# Patient Record
Sex: Female | Born: 1985 | Race: Black or African American | Hispanic: No | Marital: Single | Smoking: Former smoker
Health system: Southern US, Community
[De-identification: ages and names within clinical notes are randomized; demographics above are authoritative.]

## PROBLEM LIST (undated history)

## (undated) DIAGNOSIS — I10 Essential (primary) hypertension: Secondary | ICD-10-CM

## (undated) DIAGNOSIS — J45909 Unspecified asthma, uncomplicated: Secondary | ICD-10-CM

## (undated) DIAGNOSIS — F419 Anxiety disorder, unspecified: Secondary | ICD-10-CM

## (undated) DIAGNOSIS — F4310 Post-traumatic stress disorder, unspecified: Secondary | ICD-10-CM

## (undated) HISTORY — PX: LEEP: SHX91

---

## 2007-07-12 ENCOUNTER — Encounter: Payer: Self-pay | Admitting: Obstetrics and Gynecology

## 2007-07-12 ENCOUNTER — Ambulatory Visit: Payer: Self-pay | Admitting: Obstetrics & Gynecology

## 2011-06-14 LAB — POCT URINALYSIS DIP (DEVICE)
Bilirubin Urine: NEGATIVE
Glucose, UA: NEGATIVE
Ketones, ur: NEGATIVE
Nitrite: NEGATIVE
pH: 8.5 — ABNORMAL HIGH

## 2014-07-29 ENCOUNTER — Encounter (HOSPITAL_COMMUNITY): Payer: Self-pay | Admitting: Physical Medicine and Rehabilitation

## 2014-07-29 ENCOUNTER — Emergency Department (HOSPITAL_COMMUNITY)
Admission: EM | Admit: 2014-07-29 | Discharge: 2014-07-29 | Disposition: A | Discharge status: Home / Self Care | Payer: Self-pay | Attending: Emergency Medicine | Admitting: Emergency Medicine

## 2014-07-29 DIAGNOSIS — Z720 Tobacco use: Secondary | ICD-10-CM | POA: Insufficient documentation

## 2014-07-29 DIAGNOSIS — Z3202 Encounter for pregnancy test, result negative: Secondary | ICD-10-CM | POA: Insufficient documentation

## 2014-07-29 DIAGNOSIS — Z711 Person with feared health complaint in whom no diagnosis is made: Secondary | ICD-10-CM

## 2014-07-29 DIAGNOSIS — N760 Acute vaginitis: Secondary | ICD-10-CM | POA: Insufficient documentation

## 2014-07-29 DIAGNOSIS — B9689 Other specified bacterial agents as the cause of diseases classified elsewhere: Secondary | ICD-10-CM

## 2014-07-29 DIAGNOSIS — Z202 Contact with and (suspected) exposure to infections with a predominantly sexual mode of transmission: Secondary | ICD-10-CM | POA: Insufficient documentation

## 2014-07-29 LAB — URINALYSIS, ROUTINE W REFLEX MICROSCOPIC
Glucose, UA: NEGATIVE mg/dL
Hgb urine dipstick: NEGATIVE
Ketones, ur: 15 mg/dL — AB
Nitrite: NEGATIVE
Protein, ur: NEGATIVE mg/dL
Specific Gravity, Urine: 1.03 (ref 1.005–1.030)
Urobilinogen, UA: 1 mg/dL (ref 0.0–1.0)
pH: 6 (ref 5.0–8.0)

## 2014-07-29 LAB — WET PREP, GENITAL
Trich, Wet Prep: NONE SEEN
Yeast Wet Prep HPF POC: NONE SEEN

## 2014-07-29 LAB — URINE MICROSCOPIC-ADD ON

## 2014-07-29 LAB — RPR

## 2014-07-29 LAB — POC URINE PREG, ED: Preg Test, Ur: NEGATIVE

## 2014-07-29 LAB — HIV ANTIBODY (ROUTINE TESTING W REFLEX): HIV 1&2 Ab, 4th Generation: NONREACTIVE

## 2014-07-29 MED ORDER — METRONIDAZOLE 500 MG PO TABS: 500.0000 mg | ORAL_TABLET | Freq: Two times a day (BID) | ORAL | Status: DC

## 2014-07-29 MED ORDER — LIDOCAINE HCL (PF) 1 % IJ SOLN
0.9000 mL | Freq: Once | INTRAMUSCULAR | Status: AC
  Administered 2014-07-29: 0.9 mL via INTRADERMAL
  Filled 2014-07-29: qty 5

## 2014-07-29 MED ORDER — CEFTRIAXONE SODIUM 250 MG IJ SOLR
250.0000 mg | Freq: Once | INTRAMUSCULAR | Status: AC
  Administered 2014-07-29: 250 mg via INTRAMUSCULAR
  Filled 2014-07-29: qty 250

## 2014-07-29 MED ORDER — AZITHROMYCIN 250 MG PO TABS
1000.0000 mg | ORAL_TABLET | Freq: Once | ORAL | Status: AC
Start: 2014-07-29 — End: 2014-07-29
  Administered 2014-07-29: 1000 mg via ORAL
  Filled 2014-07-29: qty 4

## 2014-07-29 NOTE — ED Notes (Signed)
Pt presents to department for evaluation of vaginal discharge and urinary frequency. Denies pain. Pt is alert and oriented x4. States symptoms started after intercourse last week.

## 2014-07-29 NOTE — ED Provider Notes (Signed)
CSN: [id number]     Arrival date & time 07/29/14  1049 History   First MD Initiated Contact with Patient 07/29/14 1100     Chief Complaint  Patient presents with  . Vaginal Discharge  . Urinary Frequency     (Consider location/radiation/quality/duration/timing/severity/associated sxs/prior Treatment) HPI Pt is a 28yo female presenting to ED with c/o vaginal discharge and urinary frequency that started 3-4 days ago after she had intercourse last week. Pt states it is uncomfortable to pee, and she has noticed a thick white vaginal discharge. Pt denies having BV in the past but has had yeast infections. Denies fever, n/v/d. Pt states she is new in town and does not have a PCP yet but would like to be checked for STDs and treated.    History reviewed. No pertinent past medical history. History reviewed. No pertinent past surgical history. No family history on file. History  Substance Use Topics  . Smoking status: Current Every Day Smoker  . Smokeless tobacco: Not on file  . Alcohol Use: No   OB History    No data available     Review of Systems  Constitutional: Negative for fever and chills.  Respiratory: Negative for cough and shortness of breath.   Gastrointestinal: Negative for nausea, vomiting, abdominal pain and diarrhea.  Genitourinary: Positive for dysuria, vaginal discharge and vaginal pain ( irritation). Negative for urgency, frequency, flank pain, decreased urine volume, vaginal bleeding and pelvic pain.  All other systems reviewed and are negative.     Allergies  Review of patient's allergies indicates no known allergies.  Home Medications   Prior to Admission medications   Medication Sig Start Date End Date Taking? Authorizing Provider  metroNIDAZOLE (FLAGYL) 500 MG tablet Take 1 tablet (500 mg total) by mouth 2 (two) times daily. One po bid x 7 days 07/29/14   Junius FinnerErin O'Malley, PA-C   BP 131/90 mmHg  Pulse 97  Temp(Src) 98.7 F (37.1 C) (Oral)  Resp 18  Ht 5'  3" (1.6 m)  Wt 120 lb (54.432 kg)  BMI 21.26 kg/m2  SpO2 99% Physical Exam  Constitutional: She appears well-developed and well-nourished. No distress.  HENT:  Head: Normocephalic and atraumatic.  Eyes: Conjunctivae are normal. No scleral icterus.  Neck: Normal range of motion.  Cardiovascular: Normal rate, regular rhythm and normal heart sounds.   Pulmonary/Chest: Effort normal and breath sounds normal. No respiratory distress. She has no wheezes. She has no rales. She exhibits no tenderness.  Abdominal: Soft. Bowel sounds are normal. She exhibits no distension and no mass. There is no tenderness. There is no rebound and no guarding.  Genitourinary: Uterus normal. There is no rash, tenderness, lesion or injury on the right labia. There is no rash, tenderness, lesion or injury on the left labia. Cervix exhibits no motion tenderness, no discharge and no friability. Right adnexum displays no mass, no tenderness and no fullness. Left adnexum displays no mass, no tenderness and no fullness. No erythema, tenderness or bleeding in the vagina. No foreign body around the vagina. No signs of injury around the vagina. Vaginal discharge ( white-clear discharge) found.  Chaperoned exam: exam significant for white-clear vaginal discharge. No CMT, adnexal tenderness or masses  Musculoskeletal: Normal range of motion.  Neurological: She is alert.  Skin: Skin is warm and dry. She is not diaphoretic.  Nursing note and vitals reviewed.   ED Course  Procedures (including critical care time) Labs Review Labs Reviewed  WET PREP, GENITAL - Abnormal; Notable for  the following:    Clue Cells Wet Prep HPF POC TOO NUMEROUS TO COUNT (*)    WBC, Wet Prep HPF POC MANY (*)    All other components within normal limits  URINALYSIS, ROUTINE W REFLEX MICROSCOPIC - Abnormal; Notable for the following:    Color, Urine AMBER (*)    APPearance CLOUDY (*)    Bilirubin Urine SMALL (*)    Ketones, ur 15 (*)    Leukocytes,  UA SMALL (*)    All other components within normal limits  URINE MICROSCOPIC-ADD ON - Abnormal; Notable for the following:    Squamous Epithelial / LPF MANY (*)    Bacteria, UA MANY (*)    Crystals CA OXALATE CRYSTALS (*)    All other components within normal limits  GC/CHLAMYDIA PROBE AMP  URINE CULTURE  RPR  HIV ANTIBODY (ROUTINE TESTING)  POC URINE PREG, ED    Imaging Review No results found.   EKG Interpretation None      MDM   Final diagnoses:  Bacterial vaginitis  Concern about STD in female without diagnosis   Pt c/u dysuria and vaginal discharge after unprotected sex 1 week ago. Denies fever, n/v/d. On exam: no CMT, adnexal tenderness or mass. Vaginal discharge present.  POC pregnancy: negative UA: many bacteria, however, negative for nitrites, small leukocytes.  Many squamous cells, likely contaminated. Will send urine culture as pt is symptomatic. Wet prep: many clue cells. Will tx for BV with flagyl.  GC/Chlamydia: pending.  Not concerned for ectopic pregnancy, ovarian torsion or other emergent process taking place at this time.   Pt treated empirically per her request, azithromycin and rocephin given in ED. Pt education for STDs and community resource guide provided.  Return precautions provided. Pt verbalized understanding and agreement with tx plan.    Junius Finnerrin O'Malley, PA-C 07/29/14 1329  Raeford RazorStephen Kohut, MD 07/30/14 [phone]

## 2014-07-29 NOTE — Discharge Instructions (Signed)
Refrain from sexual intercourse for 7 days. Be sure to have all partners tested and treated for STDs.  Practice safe sex by always wearing condoms.   Emergency Department Resource Guide 1) Find a Doctor and Pay Out of Pocket Although you won't have to find out who is covered by your insurance plan, it is a good idea to ask around and get recommendations. You will then need to call the office and see if the doctor you have chosen will accept you as a new patient and what types of options they offer for patients who are self-pay. Some doctors offer discounts or will set up payment plans for their patients who do not have insurance, but you will need to ask so you aren't surprised when you get to your appointment.  2) Contact Your Local Health Department Not all health departments have doctors that can see patients for sick visits, but many do, so it is worth a call to see if yours does. If you don't know where your local health department is, you can check in your phone book. The CDC also has a tool to help you locate your state's health department, and many state websites also have listings of all of their local health departments.  3) Find a Walk-in Clinic If your illness is not likely to be very severe or complicated, you may want to try a walk in clinic. These are popping up all over the country in pharmacies, drugstores, and shopping centers. They're usually staffed by nurse practitioners or physician assistants that have been trained to treat common illnesses and complaints. They're usually fairly quick and inexpensive. However, if you have serious medical issues or chronic medical problems, these are probably not your best option.  No Primary Care Doctor: - Call Health Connect at  [phone] - they can help you locate a primary care doctor that  accepts your insurance, provides certain services, etc. - Physician Referral Service- [phone]  Chronic Pain Problems: Organization          Address  Phone   Notes  [facility]  [phone] Patients need to be referred by their primary care doctor.   Medication Assistance: Organization         Address  Phone   Notes  [facility] [place] Medication [facility] [address] [place], [place] [address] [phone]) [phone] --Must be a resident of [facility] [place] -- Must have NO insurance coverage whatsoever (no Medicaid/ Medicare, etc.) -- The pt. MUST have a primary care doctor that directs their care regularly and follows them in the community   MedAssist  [phone]   [employer]  [phone]) [phone]    Agencies that provide inexpensive medical care: Buyer, retailrganization         Address                                                       Phone  Notes  [facility]  [phone]   [facility]    [phone]   [facility] [address] [place], [place] [address] [phone]) [phone]   [facility] [place] [address], [place] [phone]   [facility]anned Parenthood    [phone]) [phone]   [facility]    [phone]   [facility]  [address], [place] Phone:  [phone], Fax:  [phone]) [phone] Hours of Operation:  9 am - 6 pm, M-F.  Also accepts Medicaid/Medicare and self-pay.  [facility]  [address], [place] Phone: [phone], Fax: [phone]. Hours of Operation:  8:30 am - 5:30 pm, M-F.  Also accepts Medicaid and self-pay.  [facility] [place] [address], [place] Phone: [phone]   [facility] [address], [place], [place] [phone], Ext. [phone] Mondays & Thursdays: 7-9 AM.  First 15 patients are seen on a first come, first serve basis.    Medicaid-accepting [facility] Providers:  Organization         Address                                                                        Phone                               Notes  [facility] [address], [place] [phone]) [phone] Also accepts self-pay patients.  [facility] [address], [place]  [phone]   [facility] [address], [place] [phone]) [phone]   [facility] [address] [place] [address], [place] [phone]) [phone]   Renaye RakersVeita Bland [address], [place]   [phone] Only accepts [place] Access [place] patients after they have their name applied to their card.   Self-Pay (no insurance) in [facility]:   Organization         Address                                                     Phone               Notes  Sickle Cell Patients, [facility] [address], [place] [phone]) [phone]   [facility] [address], [place] [phone]   [facility] [place]  [address], [place] [phone]   [facility]/Dr. Osei-Bonsu  [address] [place] [address], [place] or [address], [place] [phone] Phone number for both [place] and [place] locations is the same.  [facility] [address], [place] [phone]) [phone]   [facility] [place] [address] [place] [address], [place] or [address] [phone]   [facility] [address], [place] [phone]) [phone], phone; [phone]) [phone], fax Sees patients 1st and 3rd Saturday of every month.  Must not qualify for public or private insurance (i.e. Medicaid, Medicare, [place] Health Choice, Veterans' Benefits)  Household income should be no more than 200% of the poverty level The clinic cannot treat you if you are pregnant or think you are pregnant  Sexually transmitted diseases are not treated at the clinic.    Dental  Care: Organization         Address                                  Phone                       Notes  [facility] [address], [place] [phone] Accepts children up to age 27 who are enrolled in [place]icaid or [place] Health Choice; pregnant women with a Medicaid card; and children who have applied for Medicaid or [place] Health Choice, but were declined, whose parents can pay a reduced fee at time of service.  [facility] of [facility] [place]  [address], [place] [phone] Accepts children up to age 27 who are enrolled in [place]icaid or [place] Health Choice; pregnant women with a Medicaid card; and children who have applied for Medicaid or [place] Health Choice, but were declined, whose parents can pay a reduced fee at time of service.  [facility]  [address], [place] [phone]) [phone] Patients are seen by appointment only. Walk-ins are not accepted. [facility] will see patients 27 years of age and older. Monday - Tuesday (8am-5pm) Most Wednesdays (8:30-5pm) $30 per visit, cash only  [facility]  [address], [facility] [phone]) [phone] Patients are seen by appointment only. Walk-ins are not accepted. [facility] will see patients 27 years of age and older. One Wednesday Evening (Monthly: Volunteer Based).  $30 per visit, cash only  [facility]  [phone] for adults; Children under age 584, call Graduate Pediatric Dentistry at [phone]. Children aged 254-14, please call [phone] to request a pediatric application.  Dental services are provided in all areas of dental care including fillings, crowns and bridges, complete and partial dentures, implants, gum treatment, root canals, and extractions. Preventive care is also provided. Treatment is provided to both adults and children. Patients are selected via a  lottery and there is often a waiting list.   [facility] [address], [place]  [phone]) [phone] [email or web address]   [facility] [address], [place], [place] [phone], Ext. [phone] Second and Fourth Thursday of each month, opens at 6:30 AM; Clinic ends at 9 AM.  Patients are seen on a first-come first-served basis, and a limited number are seen during each clinic.   [facility]  [address], [place], [place] [phone]   Eligibility Requirements You must have lived in [place], [place], or [place] counties for at least the last three months.   You cannot be eligible for state or federal sponsored National Cityhealthcare insurance, including CIGNAVeterans Administration, [place]icaid, or [employer] EntertainmentMedicare.   You generally cannot be eligible for healthcare insurance through your employer.    How to apply: Eligibility screenings are held every  Tuesday and Wednesday afternoon from 1:00 pm until 4:00 pm. You do not need an appointment for the interview!  [facility] [address], [place], [place] [phone]   [facility]  [phone]   [facility]  [phone]   [facility]  [phone]    Behavioral Health Resources in the Community: Intensive Outpatient Programs Organization         Address                                              Phone              Notes  [facility] [address], [place], [place] [phone]   [facility] [address], [place], [place] [phone]   [facility] [address], [place], [place]  [phone]   [facility] [address],  [place], [place] [phone] or [phone]   Substance Abuse Resources Organization         Address                                Phone  Notes  [facility] Drug [facility]  [phone]   [facility]   [phone]   [facility]  [phone]   [facility]  [phone]   Residential & Outpatient Substance Abuse Program  [phone]   Psychological Services Organization         Address                                  Phone                Notes  [facility]  [phone]- [phone]   [facility]  [phone]- [phone]   [facility] [address], [place] [phone] or [phone]    Mobile Crisis Teams Organization         Address  Phone  Notes  [facility], Mobile Crisis Care Unit  [phone]   [facility]  [address] [place], [place] [phone]   Doristine LocksSharon DeEsch [address] [place] [phone]    Self-Help/Support Groups Organization         Address                         Phone             Notes  [facility] of [place] - variety of support groups  336- [phone] Call for more information  Narcotics Anonymous (NA), [facility] [address], [place]  2 meetings at this location   Statisticianesidential Treatment Programs Organization         Address                                                    Phone              Notes  [facility] [phone]  [address],    [place]  [phone]   [facility]  [address], [place] [phone], [place], [place] [phone]   [facility] [address], [place] [phone] Admissions: 8am-3pm M-F  [facility] [address]    [place], [place] [phone]   [facility] [address], [place], [place] [phone]   [facility] [address]  [place], [place] [phone]   [facility] - Intensive Outpatient [address], [place], [place] [phone]   [facility] ([facility] [address]  [place], [place] [phone] or [phone]   [facility] ([facility]) [address] [place], [place] [phone] Accepts Medicaid  [facility] [address]  [place] [phone] Substance Abuse/Addiction Treatment   [facility] Behavioral Health Resources Organization         Address                                                            Phone                    Notes  [facility]  [phone]   Angie FavaJulie Brannon, PhD [address], [address] [place], [place]   [phone](336) [phone] or [phone]) [phone]   [facility]   [address] [place], [place] [phone]) [phone]   [facility] [address], [place], [place] [phone] Insurance/Medicaid/sponsorship through [facility]  Faith and Families [address]                                    [place], [place] [phone] Therapy/tele-psych/case  [facility] [address]   [place], [place] [phone]) [phone]    Dr. Lolly MustacheArfeen  [phone](336) [phone]   [facility] [place]  [employer] [facility] 1) [address],  2) [address], [place] 3)  [address], [place] [phone](336) [phone]  [phone](336) [phone]   [facility] Child Abuse Hotline [phone]) [phone] or [phone](336) [phone] (After Hours)

## 2014-07-30 LAB — URINE CULTURE: Colony Count: 9000

## 2014-07-30 LAB — GC/CHLAMYDIA PROBE AMP
CT Probe RNA: POSITIVE — AB
GC Probe RNA: NEGATIVE

## 2014-07-31 ENCOUNTER — Telehealth: Payer: Self-pay | Admitting: Emergency Medicine

## 2014-07-31 NOTE — Telephone Encounter (Signed)
Positive Chlamydia Treated with Rocephin and Zithromax, sensitive to same per protocol [facility] fax[facility]  Will contact patient.

## 2014-08-04 ENCOUNTER — Telehealth (HOSPITAL_COMMUNITY): Payer: Self-pay

## 2014-08-04 NOTE — ED Notes (Signed)
returned call. verified ID. informed of lab results. treated per protocol. advised to abstain from sexual activity x 10days and to notify partner(s) for testing and treatment

## 2015-02-20 ENCOUNTER — Encounter (HOSPITAL_COMMUNITY): Payer: Self-pay | Admitting: Emergency Medicine

## 2015-02-20 ENCOUNTER — Emergency Department (HOSPITAL_COMMUNITY)
Admission: EM | Admit: 2015-02-20 | Discharge: 2015-02-20 | Disposition: A | Discharge status: Home / Self Care | Payer: Self-pay | Attending: Emergency Medicine | Admitting: Emergency Medicine

## 2015-02-20 DIAGNOSIS — I10 Essential (primary) hypertension: Secondary | ICD-10-CM | POA: Insufficient documentation

## 2015-02-20 DIAGNOSIS — Z720 Tobacco use: Secondary | ICD-10-CM | POA: Insufficient documentation

## 2015-02-20 DIAGNOSIS — M25531 Pain in right wrist: Secondary | ICD-10-CM | POA: Insufficient documentation

## 2015-02-20 HISTORY — DX: Essential (primary) hypertension: I10

## 2015-02-20 MED ORDER — KETOROLAC TROMETHAMINE 60 MG/2ML IM SOLN
60.0000 mg | Freq: Once | INTRAMUSCULAR | Status: AC
  Administered 2015-02-20: 60 mg via INTRAMUSCULAR
  Filled 2015-02-20: qty 2

## 2015-02-20 NOTE — Discharge Instructions (Signed)
Return to the emergency room with worsening of symptoms, new symptoms or with symptoms that are concerning, especially fevers, redness, swelling, unable to move hands, numbness, tingling, weakness. Wear brace for first 24 hours and then every night. RICE: Rest, Ice (three cycles of 20 mins on, off at least twice a day), compression/brace, elevation. Heating pad works well for back pain. Ibuprofen 400mg  (2 tablets 200mg ) every 5-6 hours for 3-5 days. Follow up with PCP/orthopedist if symptoms worsen or are persistent. Read below information and follow recommendations. Carpal Tunnel Syndrome The carpal tunnel is a narrow area located on the palm side of your wrist. The tunnel is formed by the wrist bones and ligaments. Nerves, blood vessels, and tendons pass through the carpal tunnel. Repeated wrist motion or certain diseases may cause swelling within the tunnel. This swelling pinches the main nerve in the wrist (median nerve) and causes the painful hand and arm condition called carpal tunnel syndrome. CAUSES   Repeated wrist motions.  Wrist injuries.  Certain diseases like arthritis, diabetes, alcoholism, hyperthyroidism, and kidney failure.  Obesity.  Pregnancy. SYMPTOMS   A "pins and needles" feeling in your fingers or hand, especially in your thumb, index and middle fingers.  Tingling or numbness in your fingers or hand.  An aching feeling in your entire arm, especially when your wrist and elbow are bent for long periods of time.  Wrist pain that goes up your arm to your shoulder.  Pain that goes down into your palm or fingers.  A weak feeling in your hands. DIAGNOSIS  Your health care provider will take your history and perform a physical exam. An electromyography test may be needed. This test measures electrical signals sent out by your nerves into the muscles. The electrical signals are usually slowed by carpal tunnel syndrome. You may also need X-rays. TREATMENT    Carpal tunnel syndrome may clear up by itself. Your health care provider may recommend a wrist splint or medicine such as a nonsteroidal anti-inflammatory medicine. Cortisone injections may help. Sometimes, surgery may be needed to free the pinched nerve.  HOME CARE INSTRUCTIONS   Take all medicine as directed by your health care provider. Only take over-the-counter or prescription medicines for pain, discomfort, or fever as directed by your health care provider.  If you were given a splint to keep your wrist from bending, wear it as directed. It is important to wear the splint at night. Wear the splint for as long as you have pain or numbness in your hand, arm, or wrist. This may take 1 to 2 months.  Rest your wrist from any activity that may be causing your pain. If your symptoms are work-related, you may need to talk to your employer about changing to a job that does not require using your wrist.  Put ice on your wrist after long periods of wrist activity.  Put ice in a plastic bag.  Place a towel between your skin and the bag.  Leave the ice on for 15-20 minutes, 03-04 times a day.  Keep all follow-up visits as directed by your health care provider. This includes any orthopedic referrals, physical therapy, and rehabilitation. Any delay in getting necessary care could result in a delay or failure of your condition to heal. SEEK IMMEDIATE MEDICAL CARE IF:   You have new, unexplained symptoms.  Your symptoms get worse and are not helped or controlled with medicines. MAKE SURE YOU:   Understand these instructions.  Will watch your condition.  Will get help right away if you are not doing well or get worse. Document Released: 08/19/2000 Document Revised: 01/06/2014 Document Reviewed: 07/08/2011 [facility] Patient Information 2015 [place], [place]. This information is not intended to replace advice given to you by your health care provider. Make sure you discuss any questions you have  with your health care provider.

## 2015-02-20 NOTE — ED Provider Notes (Signed)
CSN: [id number]     Arrival date & time 02/20/15  1937 History  This chart was scribed for non-physician practitioner Oswaldo Conroy, PA-C working with Lorre Nick, MD by Lyndel Safe, ED Scribe. This patient was seen in room TR07C/TR07C and the patient's care was started at 8:17 PM.   Chief Complaint  Patient presents with  . Wrist Pain   The history is provided by the patient. No language interpreter was used.   HPI Comments: Taylor Sherman is a 29 y.o. female, with a PMhx of HTN, who presents to the Emergency Department complaining of intermittent, moderate right wrist pain and weakness that began 3 days ago. She notes the pain is exacerbated upon movement of her arm/wrist. Pt reports typing frequently and using her cell phone which also exacerbates the pain. She reports the pain began 2 days after she had blood drawn on Tuesday from her right wrist, after she noticed swelling and bruising to the site. She has taken a tramadol pta with no relief. Denies fevers, chills, numbness, tingling, redness, swelling, injury to left wrist.   Past Medical History  Diagnosis Date  . Hypertension    History reviewed. No pertinent past surgical history. No family history on file. History  Substance Use Topics  . Smoking status: Current Every Day Smoker  . Smokeless tobacco: Not on file  . Alcohol Use: No   OB History    No data available     Review of Systems  Constitutional: Negative for fever and chills.  Musculoskeletal: Positive for myalgias and arthralgias ( right wrist ).  Skin: Negative for color change and wound.  Neurological: Negative for weakness and numbness.   Allergies  Review of patient's allergies indicates no known allergies.  Home Medications   Prior to Admission medications   Medication Sig Start Date End Date Taking? Authorizing Provider  metroNIDAZOLE (FLAGYL) 500 MG tablet Take 1 tablet (500 mg total) by mouth 2 (two) times daily. One po bid x 7 days 07/29/14    Junius Finner, PA-C   BP 159/98 mmHg  Pulse 76  Temp(Src) 97.5 F (36.4 C) (Oral)  Resp 14  Ht  (1.6 m)  Wt 117 lb (53.071 kg)  BMI 20.73 kg/m2  SpO2 98%  LMP 02/13/2015 Physical Exam  Constitutional: She appears well-developed and well-nourished. No distress.  HENT:  Head: Normocephalic and atraumatic.  Eyes: Conjunctivae and EOM are normal. Right eye exhibits no discharge. Left eye exhibits no discharge.  Cardiovascular: Normal rate and regular rhythm.   2+ radial pulses equal bilaterally.   Pulmonary/Chest: Effort normal and breath sounds normal. No respiratory distress. She has no wheezes.  Abdominal: Soft. Bowel sounds are normal. She exhibits no distension. There is no tenderness.  Musculoskeletal:  Pain with right wrist flexion.  Positive phalens test. Tenderness to right thenar eminence and right radial wrist. FROM No swelling or erythema.  Neurological: She is alert. She exhibits normal muscle tone. Coordination normal.  5/5 strength bilaterally. Sensation intact.   Skin: Skin is warm and dry. She is not diaphoretic.  Nursing note and vitals reviewed.  ED Course  Procedures  DIAGNOSTIC STUDIES: Oxygen Saturation is 98% on RA, normal by my interpretation.    COORDINATION OF CARE: 8:21 PM Discussed treatment plan to order NSAID injection with pt. Advised RICE therapy and ibuprofen. Pt acknowledges and agrees to plan.   Labs Review Labs Reviewed - No data to display  Imaging Review No results found.   EKG Interpretation None  MDM   Final diagnoses:  Right wrist pain   Patient presenting with right wrist pain. No injury. Neurovascularly intact. No significant swelling or erythema. Full range of motion. I doubt septic arthritis. Patient with positive Phalen's test as well as reported history of repetitive use with typing and using her cell phone. 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 Patient given brace in ED and instructed on ibuprofen use  as well as rice. Patient to follow-up with PCP/orthopedics for persistent symptoms.  Discussed return precautions with patient. Discussed all results and patient verbalizes understanding and agrees with plan.  I personally performed the services described in this documentation, which was scribed in my presence. The recorded information has been reviewed and is accurate.    Oswaldo Conroy, PA-C 02/20/15 2033  Lorre Nick, MD 02/21/15 2124

## 2015-02-20 NOTE — ED Notes (Signed)
No answer when called 

## 2015-02-20 NOTE — ED Notes (Signed)
Pt. reports right wrist pain onset Tuesday this week , pt. stated pain started when she had a venipuncture for blood test at her wrist .

## 2015-09-22 ENCOUNTER — Emergency Department (HOSPITAL_COMMUNITY)
Admission: EM | Admit: 2015-09-22 | Discharge: 2015-09-22 | Disposition: A | Discharge status: Home / Self Care | Payer: Medicaid Other | Attending: Emergency Medicine | Admitting: Emergency Medicine

## 2015-09-22 ENCOUNTER — Encounter (HOSPITAL_COMMUNITY): Payer: Self-pay

## 2015-09-22 ENCOUNTER — Emergency Department (HOSPITAL_COMMUNITY): Payer: Medicaid Other

## 2015-09-22 DIAGNOSIS — M546 Pain in thoracic spine: Secondary | ICD-10-CM | POA: Diagnosis not present

## 2015-09-22 DIAGNOSIS — F17200 Nicotine dependence, unspecified, uncomplicated: Secondary | ICD-10-CM | POA: Diagnosis not present

## 2015-09-22 DIAGNOSIS — Z3202 Encounter for pregnancy test, result negative: Secondary | ICD-10-CM | POA: Diagnosis not present

## 2015-09-22 DIAGNOSIS — I10 Essential (primary) hypertension: Secondary | ICD-10-CM | POA: Insufficient documentation

## 2015-09-22 DIAGNOSIS — M549 Dorsalgia, unspecified: Secondary | ICD-10-CM | POA: Diagnosis present

## 2015-09-22 LAB — CBC WITH DIFFERENTIAL/PLATELET
BASOS ABS: 0 10*3/uL (ref 0.0–0.1)
Basophils Relative: 1 %
EOS ABS: 0.1 10*3/uL (ref 0.0–0.7)
EOS PCT: 3 %
HEMATOCRIT: 42.9 % (ref 36.0–46.0)
Hemoglobin: 13.9 g/dL (ref 12.0–15.0)
Lymphocytes Relative: 51 %
Lymphs Abs: 1.9 10*3/uL (ref 0.7–4.0)
MCH: 29.4 pg (ref 26.0–34.0)
MCHC: 32.4 g/dL (ref 30.0–36.0)
MCV: 90.9 fL (ref 78.0–100.0)
MONO ABS: 0.4 10*3/uL (ref 0.1–1.0)
MONOS PCT: 11 %
Neutro Abs: 1.2 10*3/uL — ABNORMAL LOW (ref 1.7–7.7)
Neutrophils Relative %: 34 %
PLATELETS: 209 10*3/uL (ref 150–400)
RBC: 4.72 MIL/uL (ref 3.87–5.11)
RDW: 13.8 % (ref 11.5–15.5)
WBC: 3.6 10*3/uL — ABNORMAL LOW (ref 4.0–10.5)

## 2015-09-22 LAB — COMPREHENSIVE METABOLIC PANEL
ALBUMIN: 4.1 g/dL (ref 3.5–5.0)
ALT: 30 U/L (ref 14–54)
ANION GAP: 12 (ref 5–15)
AST: 28 U/L (ref 15–41)
Alkaline Phosphatase: 44 U/L (ref 38–126)
BUN: 14 mg/dL (ref 6–20)
CALCIUM: 9.8 mg/dL (ref 8.9–10.3)
CO2: 21 mmol/L — AB (ref 22–32)
CREATININE: 0.77 mg/dL (ref 0.44–1.00)
Chloride: 106 mmol/L (ref 101–111)
GFR calc Af Amer: >60 mL/min (ref >60)
GFR calc non Af Amer: >60 mL/min (ref >60)
GLUCOSE: 88 mg/dL (ref 65–99)
Potassium: 4.3 mmol/L (ref 3.5–5.1)
SODIUM: 139 mmol/L (ref 135–145)
TOTAL PROTEIN: 6.7 g/dL (ref 6.5–8.1)
Total Bilirubin: 0.4 mg/dL (ref 0.3–1.2)

## 2015-09-22 LAB — URINALYSIS, ROUTINE W REFLEX MICROSCOPIC
BILIRUBIN URINE: NEGATIVE
GLUCOSE, UA: NEGATIVE mg/dL
Hgb urine dipstick: NEGATIVE
KETONES UR: NEGATIVE mg/dL
LEUKOCYTES UA: NEGATIVE
NITRITE: NEGATIVE
PH: 6.5 (ref 5.0–8.0)
Protein, ur: NEGATIVE mg/dL
SPECIFIC GRAVITY, URINE: 1.023 (ref 1.005–1.030)

## 2015-09-22 LAB — I-STAT TROPONIN, ED: TROPONIN I, POC: 0 ng/mL (ref 0.00–0.08)

## 2015-09-22 LAB — D-DIMER, QUANTITATIVE: D-Dimer, Quant: <0.27 ug/mL-FEU (ref 0.00–0.50)

## 2015-09-22 LAB — LIPASE, BLOOD: Lipase: 37 U/L (ref 11–51)

## 2015-09-22 LAB — POC URINE PREG, ED: Preg Test, Ur: NEGATIVE

## 2015-09-22 MED ORDER — KETOROLAC TROMETHAMINE 30 MG/ML IJ SOLN
30.0000 mg | Freq: Once | INTRAMUSCULAR | Status: AC
  Administered 2015-09-22: 30 mg via INTRAVENOUS
  Filled 2015-09-22: qty 1

## 2015-09-22 MED ORDER — IBUPROFEN 600 MG PO TABS: 600.0000 mg | ORAL_TABLET | Freq: Three times a day (TID) | ORAL | Status: DC | PRN

## 2015-09-22 MED ORDER — SODIUM CHLORIDE 0.9 % IV BOLUS (SEPSIS)
1000.0000 mL | Freq: Once | INTRAVENOUS | Status: AC
  Administered 2015-09-22: 1000 mL via INTRAVENOUS

## 2015-09-22 NOTE — ED Notes (Signed)
Patient here with right sided flank pain x 3 days, denies injury, denies urinary symptoms

## 2015-09-22 NOTE — ED Provider Notes (Signed)
CSN: [id number]     Arrival date & time 09/22/15  1351 History   First MD Initiated Contact with Patient 09/22/15 1859     Chief Complaint  Patient presents with  . Flank Pain     (Consider location/radiation/quality/duration/timing/severity/associated sxs/prior Treatment) HPI  30 year old female presents with right-sided flank/back pain for the past 3 days. Started all of a sudden and has been gradually worsening. Pain is constant. Occasionally the pain seems to radiate up to her right shoulder. It is occasionally worse with deep breaths. Certain movements also make the pain worse. Does not remember any trauma. No urinary symptoms including no dysuria or hematuria. No abdominal pain. No vaginal symptoms. Denies cough or shortness of breath. Originally tried [employer] and Tums with no relief. Has also been taking Tylenol but has not helped. No nausea or vomiting. Has an implantable birth control in her arm. No leg swelling.  Past Medical History  Diagnosis Date  . Hypertension    History reviewed. No pertinent past surgical history. No family history on file. Social History  Substance Use Topics  . Smoking status: Current Every Day Smoker  . Smokeless tobacco: None  . Alcohol Use: No   OB History    No data available     Review of Systems  Constitutional: Negative for fever.  Respiratory: Negative for shortness of breath.   Cardiovascular: Negative for chest pain.  Gastrointestinal: Negative for nausea, vomiting and abdominal pain.  Genitourinary: Positive for flank pain. Negative for dysuria and hematuria.  Musculoskeletal: Positive for back pain.  All other systems reviewed and are negative.     Allergies  Review of patient's allergies indicates no known allergies.  Home Medications   Prior to Admission medications   Medication Sig Start Date End Date Taking? Authorizing Provider  metroNIDAZOLE (FLAGYL) 500 MG tablet Take 1 tablet (500 mg total) by mouth 2 (two) times  daily. One po bid x 7 days 07/29/14   Junius Finner, PA-C   BP 128/81 mmHg  Pulse 75  Temp(Src) 98 F (36.7 C) (Oral)  Resp 20  Ht  (1.575 m)  Wt 115 lb (52.164 kg)  BMI 21.03 kg/m2  SpO2 100% Physical Exam  Constitutional: She is oriented to person, place, and time. She appears well-developed and well-nourished.  HENT:  Head: Normocephalic and atraumatic.  Right Ear: External ear normal.  Left Ear: External ear normal.  Nose: Nose normal.  Eyes: Right eye exhibits no discharge. Left eye exhibits no discharge.  Cardiovascular: Normal rate, regular rhythm and normal heart sounds.   Pulmonary/Chest: Effort normal and breath sounds normal.    Abdominal: Soft. She exhibits no distension. There is no tenderness.  Neurological: She is alert and oriented to person, place, and time.  Skin: Skin is warm and dry.  Nursing note and vitals reviewed.   ED Course  Procedures (including critical care time) Labs Review Labs Reviewed  URINALYSIS, ROUTINE W REFLEX MICROSCOPIC (NOT AT [facility]) - Abnormal; Notable for the following:    APPearance HAZY (*)    All other components within normal limits  COMPREHENSIVE METABOLIC PANEL - Abnormal; Notable for the following:    CO2 21 (*)    All other components within normal limits  CBC WITH DIFFERENTIAL/PLATELET - Abnormal; Notable for the following:    WBC 3.6 (*)    Neutro Abs 1.2 (*)    All other components within normal limits  LIPASE, BLOOD  D-DIMER, QUANTITATIVE (NOT AT [facility])  POC URINE PREG, ED  Rosezena Sensor, ED    Imaging Review Dg Chest 2 View  09/22/2015  CLINICAL DATA:  Right chest pain for 4 days. No known injury. Initial encounter. EXAM: CHEST  2 VIEW COMPARISON:  None. FINDINGS: The lungs are clear. Heart size is normal. No pneumothorax or pleural effusion. No focal bony abnormality. IMPRESSION: Negative chest. Electronically Signed   By: Drusilla Kanner M.D.   On: 09/22/2015 20:15   US Abdomen Complete  09/22/2015   CLINICAL DATA:  Three-day history of right flank region pain EXAM: ABDOMEN ULTRASOUND COMPLETE COMPARISON:  None. FINDINGS: Gallbladder: Gallbladder wall is upper normal in thickness with mild contraction of the gallbladder. No pericholecystic fluid. No gallstones apparent. No sonographic Murphy sign noted by sonographer. Common bile duct: Diameter: 2 mm. No intrahepatic, common hepatic, or common bile duct dilatation. Liver: No focal lesion identified. Within normal limits in parenchymal echogenicity. IVC: No abnormality visualized. Pancreas: No mass or inflammatory focus. Spleen: Size and appearance within normal limits. Right Kidney: Length: 10.4 cm. Echogenicity within normal limits. No mass or hydronephrosis visualized. Left Kidney: Length: 11.1 cm. Echogenicity within normal limits. No mass or hydronephrosis visualized. Abdominal aorta: No aneurysm visualized. Other findings: No demonstrable ascites. IMPRESSION: Gallbladder slightly contracted with wall thickness upper normal. Gallbladder otherwise appears normal. If there remains concern for potential gallbladder pathology, nuclear medicine hepatobiliary imaging study could be helpful to assess for cystic duct patency. Study otherwise unremarkable. Electronically Signed   By: Bretta Bang III M.D.   On: 09/22/2015 20:27   I have personally reviewed and evaluated these images and lab results as part of my medical decision-making.   EKG Interpretation   Date/Time:  Tuesday September 22 2015 19:44:10 EST Ventricular Rate:  57 PR Interval:  139 QRS Duration: 81 QT Interval:  399 QTC Calculation: 388 R Axis:   74 Text Interpretation:  Sinus bradycardia ST elev, probable normal early  repol pattern No old tracing to compare Confirmed by Darren Caldron  MD, Wealthy Danielski  ([id number]) on 09/22/2015 8:06:03 PM      MDM   Final diagnoses:  Right-sided thoracic back pain    Patient's right-sided back pain over her lower ribs is most likely musculoskeletal in  etiology. Given that there was no trauma with pleuritic symptoms and otherwise low risk patient a d-dimer was sent and is negative. No further workup for PE indicated. No evidence of hydronephrosis or obvious gallbladder pathology. Her gallbladder is not quite normal but there is no right upper quadrant pain. Do not feel further workup indicated with benign LFTs. Much better after Toradol. Lantus she with NSAIDs and recommend follow-up with a PCP for further care. Advised of return precautions.    Pricilla Loveless, MD 09/22/15 2330

## 2016-01-23 ENCOUNTER — Emergency Department (HOSPITAL_COMMUNITY): Payer: Medicaid Other

## 2016-01-23 ENCOUNTER — Emergency Department (HOSPITAL_COMMUNITY)
Admission: EM | Admit: 2016-01-23 | Discharge: 2016-01-23 | Disposition: A | Discharge status: Home / Self Care | Payer: Medicaid Other | Attending: Emergency Medicine | Admitting: Emergency Medicine

## 2016-01-23 ENCOUNTER — Encounter (HOSPITAL_COMMUNITY): Payer: Self-pay | Admitting: Emergency Medicine

## 2016-01-23 DIAGNOSIS — F4310 Post-traumatic stress disorder, unspecified: Secondary | ICD-10-CM | POA: Diagnosis not present

## 2016-01-23 DIAGNOSIS — Z3202 Encounter for pregnancy test, result negative: Secondary | ICD-10-CM | POA: Insufficient documentation

## 2016-01-23 DIAGNOSIS — F17200 Nicotine dependence, unspecified, uncomplicated: Secondary | ICD-10-CM | POA: Diagnosis not present

## 2016-01-23 DIAGNOSIS — F419 Anxiety disorder, unspecified: Secondary | ICD-10-CM | POA: Diagnosis present

## 2016-01-23 DIAGNOSIS — I10 Essential (primary) hypertension: Secondary | ICD-10-CM | POA: Insufficient documentation

## 2016-01-23 DIAGNOSIS — R079 Chest pain, unspecified: Secondary | ICD-10-CM | POA: Diagnosis not present

## 2016-01-23 DIAGNOSIS — J45909 Unspecified asthma, uncomplicated: Secondary | ICD-10-CM | POA: Insufficient documentation

## 2016-01-23 HISTORY — DX: Unspecified asthma, uncomplicated: J45.909

## 2016-01-23 HISTORY — DX: Post-traumatic stress disorder, unspecified: F43.10

## 2016-01-23 LAB — BASIC METABOLIC PANEL
ANION GAP: 14 (ref 5–15)
BUN: 11 mg/dL (ref 6–20)
CALCIUM: 10.2 mg/dL (ref 8.9–10.3)
CO2: 18 mmol/L — ABNORMAL LOW (ref 22–32)
Chloride: 108 mmol/L (ref 101–111)
Creatinine, Ser: 0.83 mg/dL (ref 0.44–1.00)
GLUCOSE: 93 mg/dL (ref 65–99)
POTASSIUM: 3.7 mmol/L (ref 3.5–5.1)
SODIUM: 140 mmol/L (ref 135–145)

## 2016-01-23 LAB — CBC
HEMATOCRIT: 45.5 % (ref 36.0–46.0)
HEMOGLOBIN: 15.2 g/dL — AB (ref 12.0–15.0)
MCH: 30.3 pg (ref 26.0–34.0)
MCHC: 33.4 g/dL (ref 30.0–36.0)
MCV: 90.6 fL (ref 78.0–100.0)
Platelets: 265 10*3/uL (ref 150–400)
RBC: 5.02 MIL/uL (ref 3.87–5.11)
RDW: 13 % (ref 11.5–15.5)
WBC: 5.1 10*3/uL (ref 4.0–10.5)

## 2016-01-23 LAB — D-DIMER, QUANTITATIVE (NOT AT ARMC)

## 2016-01-23 LAB — I-STAT BETA HCG BLOOD, ED (MC, WL, AP ONLY)

## 2016-01-23 LAB — I-STAT TROPONIN, ED: TROPONIN I, POC: 0 ng/mL (ref 0.00–0.08)

## 2016-01-23 MED ORDER — LORAZEPAM 2 MG/ML IJ SOLN
1.0000 mg | Freq: Once | INTRAMUSCULAR | Status: AC
  Administered 2016-01-23: 1 mg via INTRAVENOUS
  Filled 2016-01-23: qty 1

## 2016-01-23 MED ORDER — LORAZEPAM 2 MG/ML IJ SOLN: 1.0000 mg | Freq: Once | INTRAMUSCULAR | Status: DC

## 2016-01-23 MED ORDER — SODIUM CHLORIDE 0.9 % IV BOLUS (SEPSIS)
1000.0000 mL | Freq: Once | INTRAVENOUS | Status: AC
  Administered 2016-01-23: 1000 mL via INTRAVENOUS

## 2016-01-23 MED ORDER — LORAZEPAM 1 MG PO TABS: 1.0000 mg | ORAL_TABLET | Freq: Three times a day (TID) | ORAL | Status: DC | PRN
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
## 2016-01-23 NOTE — ED Notes (Signed)
RN accompanied pt to restroom.  Waited outside.  This RN began to hear patient heavy breathing and opened door.  Pt actively having a panic attack.  This RN stayed with patient doing her attack and then accompanied her back to room and placed her back on monitor.  Phlebotomy and GPD at bedside at this time.

## 2016-01-23 NOTE — Discharge Instructions (Signed)
Nonspecific Chest Pain  °Chest pain can be caused by many different conditions. There is always a chance that your pain could be related to something serious, such as a heart attack or a blood clot in your lungs. Chest pain can also be caused by conditions that are not life-threatening. If you have chest pain, it is very important to follow up with your health care provider. °CAUSES  °Chest pain can be caused by: °· Heartburn. °· Pneumonia or bronchitis. °· Anxiety or stress. °· Inflammation around your heart (pericarditis) or lung (pleuritis or pleurisy). °· A blood clot in your lung. °· A collapsed lung (pneumothorax). It can develop suddenly on its own (spontaneous pneumothorax) or from trauma to the chest. °· Shingles infection (varicella-zoster virus). °· Heart attack. °· Damage to the bones, muscles, and cartilage that make up your chest wall. This can include: °¨ Bruised bones due to injury. °¨ Strained muscles or cartilage due to frequent or repeated coughing or overwork. °¨ Fracture to one or more ribs. °¨ Sore cartilage due to inflammation (costochondritis). °RISK FACTORS  °Risk factors for chest pain may include: °· Activities that increase your risk for trauma or injury to your chest. °· Respiratory infections or conditions that cause frequent coughing. °· Medical conditions or overeating that can cause heartburn. °· Heart disease or family history of heart disease. °· Conditions or health behaviors that increase your risk of developing a blood clot. °· Having had chicken pox (varicella zoster). °SIGNS AND SYMPTOMS °Chest pain can feel like: °· Burning or tingling on the surface of your chest or deep in your chest. °· Crushing, pressure, aching, or squeezing pain. °· Dull or sharp pain that is worse when you move, cough, or take a deep breath. °· Pain that is also felt in your back, neck, shoulder, or arm, or pain that spreads to any of these areas. °Your chest pain may come and go, or it may stay  constant. °DIAGNOSIS °Lab tests or other studies may be needed to find the cause of your pain. Your health care provider may have you take a test called an ambulatory ECG (electrocardiogram). An ECG records your heartbeat patterns at the time the test is performed. You may also have other tests, such as: °· Transthoracic echocardiogram (TTE). During echocardiography, sound waves are used to create a picture of all of the heart structures and to look at how blood flows through your heart. °· Transesophageal echocardiogram (TEE). This is a more advanced imaging test that obtains images from inside your body. It allows your health care provider to see your heart in finer detail. °· Cardiac monitoring. This allows your health care provider to monitor your heart rate and rhythm in real time. °· Holter monitor. This is a portable device that records your heartbeat and can help to diagnose abnormal heartbeats. It allows your health care provider to track your heart activity for several days, if needed. °· Stress tests. These can be done through exercise or by taking medicine that makes your heart beat more quickly. °· Blood tests. °· Imaging tests. °TREATMENT  °Your treatment depends on what is causing your chest pain. Treatment may include: °· Medicines. These may include: °¨ Acid blockers for heartburn. °¨ Anti-inflammatory medicine. °¨ Pain medicine for inflammatory conditions. °¨ Antibiotic medicine, if an infection is present. °¨ Medicines to dissolve blood clots. °¨ Medicines to treat coronary artery disease. °· Supportive care for conditions that do not require medicines. This may include: °¨ Resting. °¨ Applying heat   or cold packs to injured areas.  Limiting activities until pain decreases. HOME CARE INSTRUCTIONS  If you were prescribed an antibiotic medicine, finish it all even if you start to feel better.  Avoid any activities that bring on chest pain.  Do not use any tobacco products, including  cigarettes, chewing tobacco, or electronic cigarettes. If you need help quitting, ask your health care provider.  Do not drink alcohol.  Take medicines only as directed by your health care provider.  Keep all follow-up visits as directed by your health care provider. This is important. This includes any further testing if your chest pain does not go away.  If heartburn is the cause for your chest pain, you may be told to keep your head raised (elevated) while sleeping. This reduces the chance that acid will go from your stomach into your esophagus.  Make lifestyle changes as directed by your health care provider. These may include:  Getting regular exercise. Ask your health care provider to suggest some activities that are safe for you.  Eating a heart-healthy diet. A registered dietitian can help you to learn healthy eating options.  Maintaining a healthy weight.  Managing diabetes, if necessary.  Reducing stress. SEEK MEDICAL CARE IF:  Your chest pain does not go away after treatment.  You have a rash with blisters on your chest.  You have a fever. SEEK IMMEDIATE MEDICAL CARE IF:   Your chest pain is worse.  You have an increasing cough, or you cough up blood.  You have severe abdominal pain.  You have severe weakness.  You faint.  You have chills.  You have sudden, unexplained chest discomfort.  You have sudden, unexplained discomfort in your arms, back, neck, or jaw.  You have shortness of breath at any time.  You suddenly start to sweat, or your skin gets clammy.  You feel nauseous or you vomit.  You suddenly feel light-headed or dizzy.  Your heart begins to beat quickly, or it feels like it is skipping beats. These symptoms may represent a serious problem that is an emergency. Do not wait to see if the symptoms will go away. Get medical help right away. Call your local emergency services (911 in the [place] Do not drive yourself to the hospital.   This  information is not intended to replace advice given to you by your health care provider. Make sure you discuss any questions you have with your health care provider.   Document Released: 06/01/2005 Document Revised: 09/12/2014 Document Reviewed: 03/28/2014 Elsevier Interactive Patient Education 2016 [employer] Posttraumatic Stress Disorder Posttraumatic stress disorder (PTSD) is a mental disorder. It occurs after a traumatic event in your life. The traumatic events that cause PTSD are outside the range of normal human experience. Examples of these events include war, automobile accidents, natural disasters, rape, domestic violence, and violent crimes. Most people who experience these types of events are able to heal on their own. Those who do not heal develop PTSD. PTSD can happen to anyone at any age. However, people with a history of childhood abuse are at increased risk for developing PTSD.  SYMPTOMS  The traumatic event that causes PTSD must be a threat to life, cause serious injury, or involve sexual violence. The traumatic event is usually experienced directly by the person who develops PTSD. Sometimes PTSD occurs in people who witness traumas that occur to others or who hear about a trauma that occurs to a close family member or friend. The following behaviors  are characteristic of people with PTSD:  People with PTSD re-experience the traumatic event in one or more of the following ways (intrusion symptoms):  Recurrent, unwanted distressing memories while awake.  Recurrent distressing dreams.  Sensations similar to those felt when the event originally occurred (flashbacks).   Intense or prolonged emotional distress, triggered by reminders of the trauma. This may include fear, horror, intense sadness, or anger.  Marked physical reactions, triggered by reminders of the trauma. This may include racing heart, shortness of breath, sweating, and shaking.  People with PTSD avoid thoughts,  conversations, people, or activities that remind them of the traumatic event (avoidance symptoms).  People with PTSD have negative changes in their thinking and mood after the traumatic event. These changes include:  Inability to remember one or more significant aspects of the traumatic event (memory gaps).  Exaggerated negative perceptions about themselves or others, such as believing that they are bad people or that no one can be trusted.  Unrealistic assignment of blame to themselves or others for the traumatic event.  Persistent negative emotional state, such as fear, horror, anger, sadness, guilt, or shame.  Markedly decreased interest or participation in significant activities.  A loss of connection with other people.  Inability to experience positive emotions, such as happiness or love.  People with PTSD are more sensitive to their environment and react more easily than others (hyperarousal-overreactivity symptoms). These symptoms include:  Irritability, with angry outbursts toward other people or objects. The outbursts are easily triggered and may be verbal or physical.  Careless or self-destructive behavior. This may include reckless driving or drug use.  A feeling of being on edge, with increased alertness (hypervigilance).  Exaggerated reactions to stimuli, such as being easily startled.   Difficulty concentrating.  Difficulty sleeping. PTSD symptoms may start soon after a frightening event or months or years later. They last at least 1 month or longer and can affect one or more areas of functioning, such as social or occupational functioning.  DIAGNOSIS  PTSD is diagnosed through an assessment by a mental health professional. Taylor QuinYou will be asked questions about the traumatic events in your life. You will also be asked about how these events have changed your thoughts, mood, behavior, and ability to function on a daily basis. You may be asked about your use of alcohol or  drugs, which can make PTSD symptoms worse. TREATMENT  Unlike many mental disorders, which require lifelong management, PTSD is a curable condition. The goal of PTSD treatment is to neutralize the negative effects of the traumatic event on daily functioning, not erase the memory of the event. The following treatments may be prescribed to reach this goal:  Medicines. Certain medicines can reduce some PTSD symptoms. Intrusion symptoms and hyperarousal-overactivity symptoms respond best to medicines.  Counseling (talk therapy). Talk therapy with a mental health professional who is experienced in treating PTSD can help. Talk therapy can provide education, emotional support, and coping skills. Certain types of talk therapy that specifically target the traumatic events are the most effective treatment for PTSD:  Prolonged exposure therapy, which involves remembering and processing the traumatic event with a therapist in a safe environment until it no longer creates a negative emotional response.  Eye movement desensitization and reprocessing therapy, which involves the use of repetitive physical stimulation of the senses that alternates between the right and left sides of the body. It is believed that this therapy facilitates communication between the two sides of the brain. This communication helps the  mind to integrate the fragmented memories of the traumatic event into a whole story that makes sense and no longer creates a negative emotional response. Most people with PTSD benefit from a combination of these treatments.    This information is not intended to replace advice given to you by your health care provider. Make sure you discuss any questions you have with your health care provider.   Document Released: 05/17/2001 Document Revised: 09/12/2014 Document Reviewed: 11/08/2012 Elsevier Interactive Patient Education [employer]

## 2016-01-23 NOTE — ED Notes (Signed)
Ambulated PT. Pt feels better than she did before, but feels slightly dizzy.

## 2016-01-23 NOTE — ED Provider Notes (Signed)
CSN: [id number]     Arrival date & time 01/23/16  0035 History   First MD Initiated Contact with Patient 01/23/16 0129     Chief Complaint  Patient presents with  . Chest Pain     (Consider location/radiation/quality/duration/timing/severity/associated sxs/prior Treatment) HPI  Patient recently diagnosed with PTSD and also has a PMH of hypertension comes to the ER after developing chest pain, crying and anxiety. She was at a cook out when she got into a verbal, emotionally heated conversation at [employer] and decided to take her children home and leave. She reports having severe anxiety, CP, and symptoms consistent with a PTSD attack and developed severe pain. EMS gave aspirin and nitro.  She denies having any symptoms today prior to this incident.  PCP: No PCP Per Patient Kandiss A Rajewski is a 30 y.o.  female  ROS: The patient denies diaphoresis, fever, headache, weakness (general or focal), confusion, change of vision,  dysphagia, aphagia, shortness of breath,  abdominal pains, nausea, vomiting, diarrhea, lower extremity swelling, rash, neck pain.   Past Medical History  Diagnosis Date  . Hypertension   . PTSD (post-traumatic stress disorder)   . Asthma    History reviewed. No pertinent past surgical history. History reviewed. No pertinent family history. Social History  Substance Use Topics  . Smoking status: Current Some Day Smoker  . Smokeless tobacco: None  . Alcohol Use: No   OB History    No data available     Review of Systems  Review of Systems All other systems negative except as documented in the HPI. All pertinent positives and negatives as reviewed in the HPI.   Allergies  Review of patient's allergies indicates no known allergies.  Home Medications   Prior to Admission medications   Medication Sig Start Date End Date Taking? Authorizing Provider  ibuprofen (ADVIL,MOTRIN) 600 MG tablet Take 1 tablet (600 mg total) by mouth every 8 (eight) hours as  needed. Patient not taking: Reported on 01/23/2016 09/22/15   Pricilla Loveless, MD  LORazepam (ATIVAN) 1 MG tablet Take 1 tablet (1 mg total) by mouth 3 (three) times daily as needed for anxiety. 01/23/16   Caffie Sotto Neva Seat, PA-C   BP 120/71 mmHg  Pulse 95  Temp(Src) 97.9 F (36.6 C) (Oral)  Resp 19  Ht  (1.575 m)  Wt 53.071 kg  BMI 21.39 kg/m2  SpO2 100% Physical Exam  Constitutional: She is oriented to person, place, and time. She appears well-developed and well-nourished. She appears distressed (emotional distress.).  HENT:  Head: Normocephalic and atraumatic.  Nose: Nose normal.  Mouth/Throat: Uvula is midline, oropharynx is clear and moist and mucous membranes are normal.  Eyes: Pupils are equal, round, and reactive to light.  Neck: Normal range of motion. Neck supple.  Cardiovascular: Normal rate and regular rhythm.   Pulmonary/Chest: Effort normal.  Abdominal: Soft.  No signs of abdominal distention  Musculoskeletal:  No LE swelling  Neurological: She is alert and oriented to person, place, and time.  Skin: Skin is warm and dry. No rash noted.  Psychiatric: Her speech is normal and behavior is normal. Thought content normal. Her mood appears anxious. She does not exhibit a depressed mood.  + tearful  Nursing note and vitals reviewed.   ED Course  Procedures (including critical care time) Labs Review Labs Reviewed  BASIC METABOLIC PANEL - Abnormal; Notable for the following:    CO2 18 (*)    All other components within normal limits  CBC -  Abnormal; Notable for the following:    Hemoglobin 15.2 (*)    All other components within normal limits  D-DIMER, QUANTITATIVE (NOT [facility])  I-STAT TROPOININ, ED  I-STAT BETA HCG BLOOD, ED ([facility], [facility], [facility] ONLY)    Imaging Review Dg Chest 2 View  01/23/2016  CLINICAL DATA:  Shortness of breath. EXAM: CHEST  2 VIEW COMPARISON:  None. FINDINGS: The heart size and mediastinal contours are within normal limits. Both lungs are clear.  The visualized skeletal structures are unremarkable. IMPRESSION: No active cardiopulmonary disease. Electronically Signed   By: Gerome Samavid  Williams III M.D   On: 01/23/2016 01:21   I have personally reviewed and evaluated these images and lab results as part of my medical decision-making.   EKG Interpretation   Date/Time:  Saturday Jan 23 2016 00:42:21 EDT Ventricular Rate:  99 PR Interval:  150 QRS Duration: 93 QT Interval:  326 QTC Calculation: 418 R Axis:   63 Text Interpretation:  Sinus tachycardia LAE, consider biatrial enlargement  RSR' in V1 or V2, probably normal variant No significant change since last  tracing other than rate is faster Confirmed by WARD,  DO, KRISTEN ([id number])  on 01/23/2016 12:46:40 AM      MDM   Final diagnoses:  PTSD (post-traumatic stress disorder)  Chest pain, unspecified chest pain type    Chest xray unremarkable, CBC, BMP, and trop i-stat are all unremarkable. Neg hCG. Patient given 1mg  IV Ativan and has showed significant improvement in her anxiety. She, however, continues to be tachycardic at 110- d-dimer and fluids added. Patients symptoms are very atypical for PE and anxiety and stress reaction is the most likely cause. She is not longer having chest discomfort.  5: 03 am - pt has a neg d-dimer continues to not have SOB or CP. Tachycardia resolved with fluids. She was ambulated in room by RN without difficulties. Patient reuquesting dc, she will see her psychiatric/counselor soon. Will rx a small amount of Ativan prn.  Very strict return to ED precautions discussed.  Marlon Peliffany Dniyah Grant, PA-C 01/23/16 [id number]  Zadie Rhineonald Wickline, MD 01/23/16 [phone]

## 2016-12-16 ENCOUNTER — Emergency Department (HOSPITAL_COMMUNITY)
Admission: EM | Admit: 2016-12-16 | Discharge: 2016-12-16 | Disposition: A | Discharge status: Home / Self Care | Payer: Medicaid Other | Attending: Emergency Medicine | Admitting: Emergency Medicine

## 2016-12-16 ENCOUNTER — Encounter (HOSPITAL_COMMUNITY): Payer: Self-pay | Admitting: *Deleted

## 2016-12-16 DIAGNOSIS — Z87891 Personal history of nicotine dependence: Secondary | ICD-10-CM | POA: Insufficient documentation

## 2016-12-16 DIAGNOSIS — J45909 Unspecified asthma, uncomplicated: Secondary | ICD-10-CM | POA: Insufficient documentation

## 2016-12-16 DIAGNOSIS — Z79899 Other long term (current) drug therapy: Secondary | ICD-10-CM | POA: Diagnosis not present

## 2016-12-16 DIAGNOSIS — I10 Essential (primary) hypertension: Secondary | ICD-10-CM | POA: Insufficient documentation

## 2016-12-16 DIAGNOSIS — K0889 Other specified disorders of teeth and supporting structures: Secondary | ICD-10-CM | POA: Diagnosis present

## 2016-12-16 MED ORDER — CLINDAMYCIN HCL 150 MG PO CAPS: 450.0000 mg | ORAL_CAPSULE | Freq: Three times a day (TID) | ORAL | 0 refills | Status: DC

## 2016-12-16 NOTE — ED Triage Notes (Signed)
Pt states L gum pain since last night.  No relief with oral pain gel.

## 2016-12-16 NOTE — ED Notes (Signed)
Papers reviewed with patient by Wyvonnia Dusky student, with myself present to answer any farther questions. No questions and understanding verbalized

## 2016-12-16 NOTE — Discharge Instructions (Signed)
Begin taking clindamycin 3 times daily for 10 days. Ibuprofen as needed for pain. Follow up with dentist as soon as possible. Return to ED for worsening pain, extension of swelling, drainage, bleeding, trouble breathing.

## 2016-12-16 NOTE — ED Provider Notes (Signed)
[facility]-EMERGENCY DEPT Provider Note   CSN: [id number] Arrival date & time: 12/16/16  4098  By signing my name below, I, Taylor Sherman, attest that this documentation has been prepared under the direction and in the presence of Krithi Bray, PA-C. Electronically Signed: Orpah Sherman , ED Scribe. 12/16/16. 4:06 PM.   History   Chief Complaint Chief Complaint  Patient presents with  . Dental Pain    HPI Taylor Sherman is a 31 y.o. female who presents to the Emergency Department complaining of constat L sided dental pain with onset x12 hours. Pt states that she began experiencing L sided dental pain last night. She denies any similar episodes of pain in the past and reports the pain radiating to the L ear. She reports L sided dental pain, L sided abscess, appetite change. Pt has tried Orajel with no relief and reports the pain being exacerbated with swallowing. She denies cough, sneezing, fever, nausea, drainage from the area. She denies hx of cavity in the area, biting her cheek recently. Of note, pt reports being seen by her dentist x1 month ago where she states the visit was normal.    The history is provided by the patient. No language interpreter was used.    Past Medical History:  Diagnosis Date  . Asthma   . Hypertension   . PTSD (post-traumatic stress disorder)     There are no active problems to display for this patient.   History reviewed. No pertinent surgical history.  OB History    No data available       Home Medications    Prior to Admission medications   Medication Sig Start Date End Date Taking? Authorizing Provider  clindamycin (CLEOCIN) 150 MG capsule Take 3 capsules (450 mg total) by mouth 3 (three) times daily. 12/16/16   Courtland Coppa, PA-C  ibuprofen (ADVIL,MOTRIN) 600 MG tablet Take 1 tablet (600 mg total) by mouth every 8 (eight) hours as needed. Patient not taking: Reported on 01/23/2016 09/22/15   Pricilla Loveless, MD  LORazepam (ATIVAN) 1 MG tablet  Take 1 tablet (1 mg total) by mouth 3 (three) times daily as needed for anxiety. 01/23/16   Marlon Pel, PA-C    Family History No family history on file.  Social History Social History  Substance Use Topics  . Smoking status: Former Games developer  . Smokeless tobacco: Never Used  . Alcohol use No     Allergies   Patient has no known allergies.   Review of Systems Review of Systems  Constitutional: Positive for appetite change. Negative for fever.  HENT: Positive for dental problem. Negative for sneezing.        Oral abscess  Respiratory: Negative for cough.   Gastrointestinal: Negative for nausea.     Physical Exam Updated Vital Signs BP (!) 155/96 (BP Location: Right Arm)   Pulse (!) 109   Temp 98.2 F (36.8 C) (Oral)   Resp 16   Ht  (1.575 m)   Wt 60.8 kg   SpO2 100%   BMI 24.51 kg/m   Physical Exam  Constitutional: She appears well-developed and well-nourished. No distress.  HENT:  Head: Normocephalic and atraumatic.  Mouth/Throat: Normal dentition.  Swelling on the rear of the L sided molar, there is no active drainage or bleeding. Pt has good dentition.     Eyes: Conjunctivae are normal.  Neck: Neck supple.  Cardiovascular: Normal rate and regular rhythm.   No murmur heard. Pulmonary/Chest: Effort normal and breath sounds normal.  No respiratory distress.  Abdominal: Soft. There is no tenderness.  Musculoskeletal: She exhibits no edema.  Neurological: She is alert.  Skin: Skin is warm and dry.  Psychiatric: She has a normal mood and affect.  Nursing note and vitals reviewed.    ED Treatments / Results   DIAGNOSTIC STUDIES: Oxygen Saturation is 100% on RA, normal by my interpretation.   COORDINATION OF CARE: 4:06 PM-Discussed next steps with pt. Pt verbalized understanding and is agreeable with the plan.    Labs (all labs ordered are listed, but only abnormal results are displayed) Labs Reviewed - No data to display  EKG  EKG  Interpretation None       Radiology No results found.  Procedures Procedures (including critical care time)  Medications Ordered in ED Medications - No data to display   Initial Impression / Assessment and Plan / ED Course  I have reviewed the triage vital signs and the nursing notes.  Pertinent labs & imaging results that were available during my care of the patient were reviewed by me and considered in my medical decision making (see chart for details).     Patient's history and symptoms concerning for dental abscess/infection. Patient has no history of symptoms similar to this in the past. No signs of spread or Ludwig's angina present. Patient is nontoxic appearing, no fevers or color change. Will give clindamycin to cover for infection here. Anti-inflammatories for pain control. Told to follow up with dentist as soon as possible for further evaluation. Strict return precautions given.  Final Clinical Impressions(s) / ED Diagnoses   Final diagnoses:  Pain, dental    New Prescriptions Discharge Medication List as of 12/16/2016 10:11 AM    START taking these medications   Details  clindamycin (CLEOCIN) 150 MG capsule Take 3 capsules (450 mg total) by mouth 3 (three) times daily., Starting Fri 12/16/2016, Print       I personally performed the services described in this documentation, which was scribed in my presence. The recorded information has been reviewed and is accurate.     Dietrich Pates, PA-C 12/16/16 1607    Tilden Fossa, MD 12/17/16 [phone]

## 2017-04-25 ENCOUNTER — Emergency Department (HOSPITAL_COMMUNITY): Payer: Medicaid Other

## 2017-04-25 ENCOUNTER — Emergency Department (HOSPITAL_COMMUNITY)
Admission: EM | Admit: 2017-04-25 | Discharge: 2017-04-25 | Disposition: A | Discharge status: Home / Self Care | Payer: Medicaid Other | Attending: Emergency Medicine | Admitting: Emergency Medicine

## 2017-04-25 ENCOUNTER — Encounter (HOSPITAL_COMMUNITY): Payer: Self-pay | Admitting: Emergency Medicine

## 2017-04-25 ENCOUNTER — Other Ambulatory Visit: Payer: Self-pay

## 2017-04-25 DIAGNOSIS — Z79899 Other long term (current) drug therapy: Secondary | ICD-10-CM | POA: Insufficient documentation

## 2017-04-25 DIAGNOSIS — Z791 Long term (current) use of non-steroidal anti-inflammatories (NSAID): Secondary | ICD-10-CM | POA: Diagnosis not present

## 2017-04-25 DIAGNOSIS — I10 Essential (primary) hypertension: Secondary | ICD-10-CM | POA: Insufficient documentation

## 2017-04-25 DIAGNOSIS — F17210 Nicotine dependence, cigarettes, uncomplicated: Secondary | ICD-10-CM | POA: Diagnosis not present

## 2017-04-25 DIAGNOSIS — R079 Chest pain, unspecified: Secondary | ICD-10-CM

## 2017-04-25 DIAGNOSIS — F419 Anxiety disorder, unspecified: Secondary | ICD-10-CM | POA: Insufficient documentation

## 2017-04-25 DIAGNOSIS — J45909 Unspecified asthma, uncomplicated: Secondary | ICD-10-CM | POA: Diagnosis not present

## 2017-04-25 HISTORY — DX: Anxiety disorder, unspecified: F41.9

## 2017-04-25 LAB — CBC
HEMATOCRIT: 42 % (ref 36.0–46.0)
HEMOGLOBIN: 13.8 g/dL (ref 12.0–15.0)
MCH: 29 pg (ref 26.0–34.0)
MCHC: 32.9 g/dL (ref 30.0–36.0)
MCV: 88.2 fL (ref 78.0–100.0)
Platelets: 242 10*3/uL (ref 150–400)
RBC: 4.76 MIL/uL (ref 3.87–5.11)
RDW: 13.5 % (ref 11.5–15.5)
WBC: 5.1 10*3/uL (ref 4.0–10.5)

## 2017-04-25 LAB — BASIC METABOLIC PANEL
ANION GAP: 9 (ref 5–15)
BUN: <5 mg/dL — ABNORMAL LOW (ref 6–20)
CHLORIDE: 107 mmol/L (ref 101–111)
CO2: 22 mmol/L (ref 22–32)
CREATININE: 0.85 mg/dL (ref 0.44–1.00)
Calcium: 9.8 mg/dL (ref 8.9–10.3)
GFR calc non Af Amer: >60 mL/min (ref >60)
Glucose, Bld: 83 mg/dL (ref 65–99)
POTASSIUM: 4 mmol/L (ref 3.5–5.1)
SODIUM: 138 mmol/L (ref 135–145)

## 2017-04-25 LAB — I-STAT TROPONIN, ED: Troponin i, poc: 0 ng/mL (ref 0.00–0.08)

## 2017-04-25 MED ORDER — IBUPROFEN 400 MG PO TABS
600.0000 mg | ORAL_TABLET | Freq: Once | ORAL | Status: AC
  Administered 2017-04-25: 600 mg via ORAL
  Filled 2017-04-25: qty 1

## 2017-04-25 NOTE — ED Triage Notes (Signed)
Pt sts cough and pain with cough on left side x 2 days

## 2017-04-25 NOTE — ED Triage Notes (Signed)
Pt came to triage to report she is having chest pain radiating to left arm.  States onset 2 days.  Has coughed several times.  Brought to triage for EKG, blood work.

## 2017-04-25 NOTE — ED Notes (Signed)
Informed Jeff - PA of pt's BP.

## 2017-04-25 NOTE — ED Provider Notes (Signed)
[facility]-EMERGENCY DEPT Provider Note   CSN: [id number] Arrival date & time: 04/25/17  1438     History   Chief Complaint Chief Complaint  Patient presents with  . Cough  . Chest Pain    HPI Taylor Sherman is a 31 y.o. female.  HPI   31 year old female presents today with complaints of chest pain. Patient notes that at 1 PM today while laying in bed she had a panic attack reporting her heart was beating rapidly, she had numbness down her left arm and associated chest pain. Patient notes rapid respirations and shortness of breath all which improved shortly after the panic attack. Patient notes history of the same. She notes that she still has a dull ache in her left chest, reports that pushing on her chest makes symptoms better, and breathing makes him worse. Patient denies any history of DVT or PE, she denies any significant risk factors other than smoking. Patient denies any personal cardiac history, but does note a family history of MIs. Patient and she took her anti-anxiety medication but this did not improve her symptoms. She denies any cough, fever, or any other infectious etiology. She denies any lower extremity swelling or edema.    Past Medical History:  Diagnosis Date  . Anxiety   . Asthma   . Hypertension   . PTSD (post-traumatic stress disorder)     There are no active problems to display for this patient.   History reviewed. No pertinent surgical history.  OB History    No data available       Home Medications    Prior to Admission medications   Medication Sig Start Date End Date Taking? Authorizing Provider  clindamycin (CLEOCIN) 150 MG capsule Take 3 capsules (450 mg total) by mouth 3 (three) times daily. 12/16/16   Khatri, Hina, PA-C  ibuprofen (ADVIL,MOTRIN) 600 MG tablet Take 1 tablet (600 mg total) by mouth every 8 (eight) hours as needed. Patient not taking: Reported on 01/23/2016 09/22/15   Pricilla Loveless, MD  LORazepam (ATIVAN) 1 MG tablet Take 1  tablet (1 mg total) by mouth 3 (three) times daily as needed for anxiety. 01/23/16   Marlon Pel, PA-C    Family History History reviewed. No pertinent family history.  Social History Social History  Substance Use Topics  . Smoking status: Current Every Day Smoker  . Smokeless tobacco: Never Used  . Alcohol use No     Allergies   Patient has no known allergies.   Review of Systems Review of Systems  All other systems reviewed and are negative.  Physical Exam Updated Vital Signs BP (!) 154/100 (BP Location: Right Arm)   Pulse 77   Temp 98.9 F (37.2 C) (Oral)   Resp 18   SpO2 99%   Physical Exam  Constitutional: She is oriented to person, place, and time. She appears well-developed and well-nourished.  HENT:  Head: Normocephalic and atraumatic.  Eyes: Pupils are equal, round, and reactive to light. Conjunctivae are normal. Right eye exhibits no discharge. Left eye exhibits no discharge. No scleral icterus.  Neck: Normal range of motion. No JVD present. No tracheal deviation present.  Cardiovascular: Normal rate, regular rhythm, normal heart sounds and intact distal pulses.  Exam reveals no gallop and no friction rub.   No murmur heard. Pulmonary/Chest: Effort normal and breath sounds normal. No stridor. No respiratory distress. She has no wheezes. She has no rales. She exhibits tenderness.  TTP of mid sternum   Abdominal: Soft.  She exhibits no distension and no mass. There is no tenderness. There is no rebound and no guarding. No hernia.  Musculoskeletal: Normal range of motion. She exhibits no edema.  Neurological: She is alert and oriented to person, place, and time. Coordination normal.  Skin: Skin is warm.  Psychiatric: She has a normal mood and affect. Her behavior is normal. Judgment and thought content normal.  Nursing note and vitals reviewed.    ED Treatments / Results  Labs (all labs ordered are listed, but only abnormal results are displayed) Labs  Reviewed  BASIC METABOLIC PANEL - Abnormal; Notable for the following:       Result Value   BUN <5 (*)    All other components within normal limits  CBC  I-STAT TROPONIN, ED    EKG  EKG Interpretation None       Radiology Dg Chest 2 View  Result Date: 04/25/2017 CLINICAL DATA:  Pt c/o central chest pain, left arm numbness, SOB, nausea, and dry cough x 1 day. Hx of asthma AND HTN. Pt is a current smoker. EXAM: CHEST  2 VIEW COMPARISON:  01/23/2016 FINDINGS: The heart size and mediastinal contours are within normal limits. Both lungs are clear. The visualized skeletal structures are unremarkable. IMPRESSION: Negative.  No active disease. Electronically Signed   By: Myles Rosenthal M.D.   On: 04/25/2017 15:04    Procedures Procedures (including critical care time)  Medications Ordered in ED Medications  ibuprofen (ADVIL,MOTRIN) tablet 600 mg (600 mg Oral Given 04/25/17 1713)     Initial Impression / Assessment and Plan / ED Course  I have reviewed the triage vital signs and the nursing notes.  Pertinent labs & imaging results that were available during my care of the patient were reviewed by me and considered in my medical decision making (see chart for details).     Final Clinical Impressions(s) / ED Diagnoses   Final diagnoses:  Anxiety  Chest pain, unspecified type    Labs: I-STAT troponin, BNP, CBC   Assessment/Plan: 31 year old female presents status complaints of anxiety and chest pain. This is likely anxiety in nature. She is a 31 year old healthy female with very low heart score with reassuring workup here. I have very low suspicion for ACS, she is perked negative flow suspicion for PE or any other significant intrathoracic abnormality. Patient will follow up as an outpatient, she will return medially with any new or worsening signs or symptoms. Patient verbalized understanding and agreement to today's plan had no further questions or concerns at the time  discharge.      New Prescriptions Discharge Medication List as of 04/25/2017  7:10 PM       Rosalio Loud 04/25/17 2033    Loren Racer, MD 04/28/17 [phone]

## 2017-04-25 NOTE — Discharge Instructions (Signed)
Please read attached information. If you experience any new or worsening signs or symptoms please return to the emergency room for evaluation. Please follow-up with your primary care provider or specialist as discussed.  °

## 2018-02-15 ENCOUNTER — Other Ambulatory Visit (HOSPITAL_COMMUNITY)
Admission: RE | Admit: 2018-02-15 | Discharge: 2018-02-15 | Disposition: A | Discharge status: Home / Self Care | Payer: Medicaid Other | Source: Ambulatory Visit | Attending: Family Medicine | Admitting: Family Medicine

## 2018-02-15 ENCOUNTER — Encounter: Payer: Self-pay | Admitting: Family Medicine

## 2018-02-15 ENCOUNTER — Ambulatory Visit (INDEPENDENT_AMBULATORY_CARE_PROVIDER_SITE_OTHER): Payer: Medicaid Other | Admitting: Family Medicine

## 2018-02-15 VITALS — BP 168/103 | HR 56 | Ht 62.0 in | Wt 115.0 lb

## 2018-02-15 DIAGNOSIS — D069 Carcinoma in situ of cervix, unspecified: Secondary | ICD-10-CM | POA: Diagnosis present

## 2018-02-15 LAB — POCT PREGNANCY, URINE: Preg Test, Ur: NEGATIVE

## 2018-02-15 NOTE — Assessment & Plan Note (Signed)
S/p LEEP today--f/u based on pathology results

## 2018-02-15 NOTE — Progress Notes (Signed)
Patient had elevated phq9 & gad7- reports seeing [facility]; last seen 2 weeks ago.

## 2018-02-15 NOTE — Patient Instructions (Signed)
Cervical Conization, Care After °This sheet gives you information about how to care for yourself after your procedure. Your doctor may also give you more specific instructions. If you have problems or questions, contact your doctor. °Follow these instructions at home: °Medicines °· Take over-the-counter and prescription medicines only as told by your doctor. °· Do not take aspirin until your doctor says it is okay. °· If you take pain medicine: °? You may have constipation. To help treat this, your doctor may tell you to: °§ Drink enough fluid to keep your pee (urine) clear or pale yellow. °§ Take medicines. °§ Eat foods that are high in fiber. These include fresh fruits and vegetables, whole grains, bran, and beans. °§ Limit foods that are high in fat and sugar. These include fried foods and sweet foods. °? Do not drive or use heavy machines. °General instructions °· You can eat your usual diet unless your doctor tells you not to do so. °· Take showers for the first week. Do not take baths, swim, or use hot tubs until your doctor says it is okay. °· Do not douche, use tampons, or have sex until your doctor says it is okay. °· For 7-14 days after your procedure, avoid: °? Being very active. °? Exercising. °? Heavy lifting. °· Keep all follow-up visits as told by your doctor. This is important. °Contact a doctor if: °· You have a rash. °· You are dizzy or lightheaded. °· You feel sick to your stomach (nauseous). °· You throw up (vomit). °· You have fluid from your vagina (vaginal discharge) that smells bad. °Get help right away if: °· There are blood clots coming from your vagina. °· You have more bleeding than you would have in a normal period. For example, you soak a pad in less than 1 hour. °· You have a fever. °· You have more and more cramps. °· You pass out (faint). °· You have pain when peeing. °· Your have a lot of pain. °· Your pain gets worse. °· Your pain does not get better when you take your  medicine. °· You have blood in your pee. °· You throw up (vomit). °Summary °· After your procedure, take over-the-counter and prescription medicines only as told by your doctor. °· Do not douche, use tampons, or have sex until your doctor says it is okay. °· For about 7-14 days after your procedure, try not to exercise or lift heavy objects. °· Get help right away if you have new symptoms, or if your symptoms become worse. °This information is not intended to replace advice given to you by your health care provider. Make sure you discuss any questions you have with your health care provider. °Document Released: 05/31/2008 Document Revised: 08/24/2016 Document Reviewed: 08/24/2016 °Elsevier Interactive Patient Education © 2017 Elsevier Inc. ° °

## 2018-02-15 NOTE — Progress Notes (Signed)
   GYNECOLOGY OFFICE PROCEDURE NOTE  Taylor Sherman is a 32 y.o. No obstetric history on file. here for LEEP. No GYN concerns. Pap smear and colposcopy history reviewed.    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  Role of HPV,cervical dysplasia and need for close followup was empasized. Informed written consent was obtained. All questions were answered. Time out performed. Urine pregnancy test was negative.  Procedure: The patient was placed in lithotomy position and the bivalved coated speculum was placed in the patient's vagina. A grounding pad placed on the patient. Acetic acid solution was applied to the cervix and areas of acetowhite areas were noted around the transformation zone.  Local anesthesia was administered via an intracervical block using 20 ml of 2% Lidocaine with epinephrine. The suction was turned on and the Medium 1X Fisher Cone Biopsy Excisor on 2360 Watts of blended current was used to excise the area of decreased uptake and excise the entire transformation zone. Excellent hemostasis was achieved using roller ball coagulation set at 60 Watts coagulation current. Monsel's solution was then applied and the speculum was removed from the vagina. Specimens were sent to pathology.  The patient tolerated the procedure well. Post-operative instructions given to patient, including instruction to seek medical attention for persistent bright red bleeding, fever, abdominal/pelvic pain, dysuria, nausea or vomiting. She was also told about the possibility of having copious yellow to black tinged discharge for weeks. She was counseled to avoid anything in the vagina (sex/douching/tampons) for 2 weeks. She has a 4 week post-operative  check to assess wound healing, review results and discuss further management.    Taylor Sherman 02/15/2018 3:52 PM

## 2018-02-23 ENCOUNTER — Other Ambulatory Visit: Payer: Self-pay

## 2018-02-23 ENCOUNTER — Inpatient Hospital Stay (HOSPITAL_COMMUNITY)
Admission: EM | Admit: 2018-02-23 | Discharge: 2018-02-24 | Disposition: A | Discharge status: Home / Self Care | Payer: Medicaid Other | Attending: Obstetrics & Gynecology | Admitting: Obstetrics & Gynecology

## 2018-02-23 ENCOUNTER — Encounter (HOSPITAL_COMMUNITY): Payer: Self-pay | Admitting: Emergency Medicine

## 2018-02-23 DIAGNOSIS — Z9889 Other specified postprocedural states: Secondary | ICD-10-CM | POA: Insufficient documentation

## 2018-02-23 DIAGNOSIS — J45909 Unspecified asthma, uncomplicated: Secondary | ICD-10-CM | POA: Insufficient documentation

## 2018-02-23 DIAGNOSIS — N879 Dysplasia of cervix uteri, unspecified: Secondary | ICD-10-CM | POA: Diagnosis not present

## 2018-02-23 DIAGNOSIS — O10919 Unspecified pre-existing hypertension complicating pregnancy, unspecified trimester: Secondary | ICD-10-CM

## 2018-02-23 DIAGNOSIS — F17210 Nicotine dependence, cigarettes, uncomplicated: Secondary | ICD-10-CM | POA: Insufficient documentation

## 2018-02-23 DIAGNOSIS — I10 Essential (primary) hypertension: Secondary | ICD-10-CM | POA: Diagnosis not present

## 2018-02-23 DIAGNOSIS — N939 Abnormal uterine and vaginal bleeding, unspecified: Secondary | ICD-10-CM

## 2018-02-23 DIAGNOSIS — D061 Carcinoma in situ of exocervix: Secondary | ICD-10-CM | POA: Insufficient documentation

## 2018-02-23 DIAGNOSIS — R102 Pelvic and perineal pain: Secondary | ICD-10-CM | POA: Insufficient documentation

## 2018-02-23 DIAGNOSIS — F4310 Post-traumatic stress disorder, unspecified: Secondary | ICD-10-CM | POA: Diagnosis not present

## 2018-02-23 LAB — I-STAT BETA HCG BLOOD, ED (MC, WL, AP ONLY): I-stat hCG, quantitative: <5 m[IU]/mL (ref <5)

## 2018-02-23 LAB — CBC WITH DIFFERENTIAL/PLATELET
Abs Immature Granulocytes: 0 10*3/uL (ref 0.0–0.1)
Basophils Absolute: 0 10*3/uL (ref 0.0–0.1)
Basophils Relative: 1 %
Eosinophils Absolute: 0.1 10*3/uL (ref 0.0–0.7)
Eosinophils Relative: 1 %
HCT: 42.1 % (ref 36.0–46.0)
Hemoglobin: 13.2 g/dL (ref 12.0–15.0)
Immature Granulocytes: 0 %
Lymphocytes Relative: 21 %
Lymphs Abs: 1.3 10*3/uL (ref 0.7–4.0)
MCH: 29.8 pg (ref 26.0–34.0)
MCHC: 31.4 g/dL (ref 30.0–36.0)
MCV: 95 fL (ref 78.0–100.0)
Monocytes Absolute: 0.5 10*3/uL (ref 0.1–1.0)
Monocytes Relative: 9 %
Neutro Abs: 4.2 10*3/uL (ref 1.7–7.7)
Neutrophils Relative %: 68 %
Platelets: 279 10*3/uL (ref 150–400)
RBC: 4.43 MIL/uL (ref 3.87–5.11)
RDW: 14.8 % (ref 11.5–15.5)
WBC: 6.2 10*3/uL (ref 4.0–10.5)

## 2018-02-23 LAB — URINALYSIS, ROUTINE W REFLEX MICROSCOPIC
Glucose, UA: NEGATIVE mg/dL
Ketones, ur: NEGATIVE mg/dL
Nitrite: NEGATIVE
Protein, ur: 30 mg/dL — AB
RBC / HPF: >50 RBC/hpf — ABNORMAL HIGH (ref 0–5)
Specific Gravity, Urine: 1.029 (ref 1.005–1.030)
pH: 8 (ref 5.0–8.0)

## 2018-02-23 LAB — BASIC METABOLIC PANEL
Anion gap: 7 (ref 5–15)
BUN: 8 mg/dL (ref 6–20)
CO2: 28 mmol/L (ref 22–32)
Calcium: 9.1 mg/dL (ref 8.9–10.3)
Chloride: 107 mmol/L (ref 101–111)
Creatinine, Ser: 1 mg/dL (ref 0.44–1.00)
GFR calc Af Amer: >60 mL/min (ref >60)
GFR calc non Af Amer: >60 mL/min (ref >60)
Glucose, Bld: 96 mg/dL (ref 65–99)
Potassium: 3.9 mmol/L (ref 3.5–5.1)
Sodium: 142 mmol/L (ref 135–145)

## 2018-02-23 NOTE — ED Triage Notes (Addendum)
Pt states she had a LEEP procedure done for cervical cancer last Thursday, has had some bleeding since then but states around 3pm she has had some blood clots coming out. [facility] and her doctor told her to go to ED so she came here. Pt has some lower abdominal tenderness.

## 2018-02-23 NOTE — ED Provider Notes (Signed)
Patient placed in Quick Look pathway, seen and evaluated   Chief Complaint: vaginal bleeding   HPI:   Pt here with vaginal bleeding. Leep procedure last Thursday, spotting since. 3 PM today heavy bleeding with clots; 4 pads since onset.  Minor amount of lower pelvic pain. Urine normal urine.   ROS: vaginal bleeding (one)  Physical Exam:   Gen: No distress  Neuro: Awake and Alert  Skin: Warm    Focused Exam:  Minor TTP of suprapubic region - remained of abd soft NTTP   Initiation of care has begun. The patient has been counseled on the process, plan, and necessity for staying for the completion/evaluation, and the remainder of the medical screening examination    Rosalio LoudHedges, Maika Mcelveen, PA-C 02/23/18 1754    Eber HongMiller, Brian, MD 02/24/18 1622

## 2018-02-24 ENCOUNTER — Encounter (HOSPITAL_COMMUNITY): Payer: Self-pay

## 2018-02-24 DIAGNOSIS — J45909 Unspecified asthma, uncomplicated: Secondary | ICD-10-CM | POA: Diagnosis not present

## 2018-02-24 DIAGNOSIS — N879 Dysplasia of cervix uteri, unspecified: Secondary | ICD-10-CM | POA: Diagnosis not present

## 2018-02-24 DIAGNOSIS — N939 Abnormal uterine and vaginal bleeding, unspecified: Secondary | ICD-10-CM | POA: Diagnosis present

## 2018-02-24 DIAGNOSIS — O10919 Unspecified pre-existing hypertension complicating pregnancy, unspecified trimester: Secondary | ICD-10-CM

## 2018-02-24 DIAGNOSIS — I10 Essential (primary) hypertension: Secondary | ICD-10-CM

## 2018-02-24 DIAGNOSIS — F17210 Nicotine dependence, cigarettes, uncomplicated: Secondary | ICD-10-CM | POA: Diagnosis not present

## 2018-02-24 DIAGNOSIS — F4310 Post-traumatic stress disorder, unspecified: Secondary | ICD-10-CM | POA: Diagnosis not present

## 2018-02-24 DIAGNOSIS — D061 Carcinoma in situ of exocervix: Secondary | ICD-10-CM | POA: Diagnosis not present

## 2018-02-24 DIAGNOSIS — R102 Pelvic and perineal pain: Secondary | ICD-10-CM | POA: Diagnosis not present

## 2018-02-24 DIAGNOSIS — Z9889 Other specified postprocedural states: Secondary | ICD-10-CM | POA: Diagnosis present

## 2018-02-24 MED ORDER — HYDROMORPHONE HCL 1 MG/ML IJ SOLN
1.0000 mg | Freq: Once | INTRAMUSCULAR | Status: AC
  Administered 2018-02-24: 1 mg via INTRAMUSCULAR
  Filled 2018-02-24: qty 1

## 2018-02-24 MED ORDER — HYDROMORPHONE HCL 1 MG/ML IJ SOLN
1.0000 mg | Freq: Once | INTRAMUSCULAR | Status: AC
  Administered 2018-02-24: 1 mg via INTRAVENOUS
  Filled 2018-02-24: qty 1

## 2018-02-24 MED ORDER — HYDRALAZINE HCL 20 MG/ML IJ SOLN
10.0000 mg | Freq: Once | INTRAMUSCULAR | Status: AC
  Administered 2018-02-24: 10 mg via INTRAVENOUS
  Filled 2018-02-24: qty 1

## 2018-02-24 MED ORDER — ONDANSETRON HCL 4 MG/2ML IJ SOLN
4.0000 mg | Freq: Once | INTRAMUSCULAR | Status: AC
  Administered 2018-02-24: 4 mg via INTRAVENOUS
  Filled 2018-02-24: qty 2

## 2018-02-24 MED ORDER — OXYCODONE-ACETAMINOPHEN 5-325 MG PO TABS
1.0000 | ORAL_TABLET | Freq: Once | ORAL | Status: AC
  Administered 2018-02-24: 1 via ORAL
  Filled 2018-02-24: qty 1

## 2018-02-24 MED ORDER — AMLODIPINE BESYLATE 10 MG PO TABS: 10.0000 mg | ORAL_TABLET | Freq: Every day | ORAL | Status: DC

## 2018-02-24 MED ORDER — AMLODIPINE BESYLATE 10 MG PO TABS: 10.0000 mg | ORAL_TABLET | Freq: Every day | ORAL | 0 refills | Status: DC

## 2018-02-24 MED ORDER — MONSELS FERRIC SUBSULFATE EX SOLN
1.0000 "application " | Freq: Once | CUTANEOUS | Status: AC
  Administered 2018-02-24: 1 via TOPICAL
  Filled 2018-02-24 (×2): qty 30

## 2018-02-24 NOTE — MAU Note (Signed)
Taylor Sherman is a 32 y.o.  here in MAU reporting:  +vaginal bleeding Started around 5am Called the triage line and was told she had lost to much blood and needed to go the emergency room LEEP procedure last week Pain score: 1/10; right lower abdominal pain; received dilaudid and zofran [facility] Arrived to MAU via [facility] 20G IV saline lock noted in Right hand Vitals:   02/24/18 0738 02/24/18 0821  BP: (!) 151/111 (!) 140/114  Pulse: 66 63  Resp: 18 17  Temp: 98.2 F (36.8 C) 98.1 F (36.7 C)  SpO2: 100% 100%     Lab orders placed from triage: none

## 2018-02-24 NOTE — MAU Provider Note (Addendum)
History     CSN: [id number]  Arrival date and time: 02/24/18 7829   First Provider Initiated Contact with Patient 02/24/18 0831      Chief Complaint  Patient presents with  . Vaginal Bleeding   HPI  Taylor Sherman is a 32 y.o. [id number] non pregnant patient LEEP last Thursday 02/15/2018 who presents to MAU via transfer from [facility] ED for vaginal bleeding.  Patient reports "big gush of blood" around 3pm yesterday and was instructed to [facility] for evaluation, where bleeding gradually reduced until "almost gone" this morning shortly before transger.    Patient also reports previous diagnosis of chronic hypertension. Voluntarily discontinued medication in 2012 and cannot remember name of medication she was prescribed.  Unable to recognize any names of commonly prescribed meds.    Denies falls, fever, headache, other physical complaints.   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 Anxiety   . Asthma   . Hypertension   . PTSD (post-traumatic stress disorder)     Past Surgical History:  Procedure Laterality Date  . LEEP      History reviewed. No pertinent family history.  Social History   Tobacco Use  . Smoking status: Former Smoker    Last attempt to quit: 2019    Years since quitting: 0.4  . Smokeless tobacco: Never Used  Substance Use Topics  . Alcohol use: Yes    Comment: Occasional  . Drug use: No    Allergies: No Known Allergies  Medications Prior to Admission  Medication Sig Dispense Refill Last Dose  . diphenhydramine-acetaminophen (TYLENOL PM) 25-500 MG TABS tablet Take 4 tablets by mouth at bedtime as needed.   02/22/2018 at Unknown time  . DULoxetine (CYMBALTA) 30 MG capsule Take 30 mg by mouth as needed (anxiety).    unknown    Review of Systems  Gastrointestinal: Negative for abdominal pain, nausea and  vomiting.  Genitourinary: Positive for vaginal bleeding. Negative for pelvic pain, vaginal discharge and vaginal pain.  Neurological: Negative for dizziness, tremors, facial asymmetry, weakness and headaches.   Physical Exam   Blood pressure (!) 144/121, pulse 61, temperature 98.1 F (36.7 C), temperature source Oral, resp. rate 17, SpO2 100 %.  Physical Exam  Nursing note and vitals reviewed. Constitutional: She is oriented to person, place, and time. She appears well-developed and well-nourished.  Cardiovascular: Normal rate, regular rhythm, normal heart sounds and intact distal pulses.  Respiratory: Effort normal and breath sounds normal.  GI: Soft. Bowel sounds are normal.  Genitourinary: Uterus normal.  Genitourinary Comments: Monsel's solution applied in [facility]. Large collections of Monsel's visible at introitus and throughout vaginal vault.  Musculoskeletal: Normal range of motion.  Neurological: She is alert and oriented to person, place, and time. She has normal reflexes.  Skin: Skin is warm and dry.  Psychiatric: She has a normal mood and affect. Her behavior is normal. Judgment and thought content normal.    MAU Course  Procedures  MDM Observed for 90 minutes in MAU. Minimal new bleeding visualized. Lochia on pad is predominantly remaining large collections of Monsel's Solution. Discussed with patient that one option is to look for new sites of bleeding and apply additional Monsel's, but concern for dislodging existing sites and creating new bleeding issue. Patient agreed and declines additional Monsel's. Orders Placed This Encounter  Procedures  . CBC with  Differential    Standing Status:   Standing    Number of Occurrences:   1  . Basic metabolic panel    Standing Status:   Standing    Number of Occurrences:   1  . Urinalysis, Routine w reflex microscopic    Standing Status:   Standing    Number of Occurrences:   1  . Pelvic cart    Standing Status:    Standing    Number of Occurrences:   1  . I-Stat Beta hCG blood, ED ([facility], [facility], [facility] only)    Standing Status:   Standing    Number of Occurrences:   1  . Discharge patient Discharge disposition: 01-Home or Self Care; Discharge patient date: 02/24/2018    Standing Status:   Standing    Number of Occurrences:   1    Order Specific Question:   Discharge disposition    Answer:   01-Home or Self Care [1]    Order Specific Question:   Discharge patient date    Answer:   02/24/2018   Vitals:   02/24/18 0852 02/24/18 0901 02/24/18 0916 02/24/18 0931  BP: (!) 144/121 (!) 144/106 (!) 157/94 (!) 154/87  Pulse:  69 66 (!) 42  Resp:      Temp:      TempSrc:      SpO2:       Assessment and Plan  --Stable female 32 y.o. s/p LEEP 02/15/2018 --Hemodynamically stable --Bleeding minimal and controlled with application of Monsel's at [facility] --Reviewed bleeding precautions and Leep aftercare including minimal lifting for 14 days. --Patient to establish care with PCP and return to MAU or closest ED for increased in bleeding --Blood pressure responsive to IV Hydralazine --Discharge home with prescription for Norvasc 10mg  PO q day x 30 days while patient establishes care with PCP --Discharge home in stable condition --HPI, MAU course, discharge and followup discussed with Dr. Macon LargeAnyanwu, who agrees with my plan of care  Calvert CantorSamantha C Townes Fuhs, CNM 02/24/2018, 9:59 AM

## 2018-02-24 NOTE — ED Notes (Signed)
Patient still has minimal  amount of bleeding.

## 2018-02-24 NOTE — Discharge Instructions (Signed)
Cervical Conization, Care After °This sheet gives you information about how to care for yourself after your procedure. Your doctor may also give you more specific instructions. If you have problems or questions, contact your doctor. °Follow these instructions at home: °Medicines °· Take over-the-counter and prescription medicines only as told by your doctor. °· Do not take aspirin until your doctor says it is okay. °· If you take pain medicine: °? You may have constipation. To help treat this, your doctor may tell you to: °§ Drink enough fluid to keep your pee (urine) clear or pale yellow. °§ Take medicines. °§ Eat foods that are high in fiber. These include fresh fruits and vegetables, whole grains, bran, and beans. °§ Limit foods that are high in fat and sugar. These include fried foods and sweet foods. °? Do not drive or use heavy machines. °General instructions °· You can eat your usual diet unless your doctor tells you not to do so. °· Take showers for the first week. Do not take baths, swim, or use hot tubs until your doctor says it is okay. °· Do not douche, use tampons, or have sex until your doctor says it is okay. °· For 7-14 days after your procedure, avoid: °? Being very active. °? Exercising. °? Heavy lifting. °· Keep all follow-up visits as told by your doctor. This is important. °Contact a doctor if: °· You have a rash. °· You are dizzy or lightheaded. °· You feel sick to your stomach (nauseous). °· You throw up (vomit). °· You have fluid from your vagina (vaginal discharge) that smells bad. °Get help right away if: °· There are blood clots coming from your vagina. °· You have more bleeding than you would have in a normal period. For example, you soak a pad in less than 1 hour. °· You have a fever. °· You have more and more cramps. °· You pass out (faint). °· You have pain when peeing. °· Your have a lot of pain. °· Your pain gets worse. °· Your pain does not get better when you take your  medicine. °· You have blood in your pee. °· You throw up (vomit). °Summary °· After your procedure, take over-the-counter and prescription medicines only as told by your doctor. °· Do not douche, use tampons, or have sex until your doctor says it is okay. °· For about 7-14 days after your procedure, try not to exercise or lift heavy objects. °· Get help right away if you have new symptoms, or if your symptoms become worse. °This information is not intended to replace advice given to you by your health care provider. Make sure you discuss any questions you have with your health care provider. °Document Released: 05/31/2008 Document Revised: 08/24/2016 Document Reviewed: 08/24/2016 °Elsevier Interactive Patient Education © 2017 [employer] Inc. ° °

## 2018-02-24 NOTE — ED Notes (Signed)
[facility] called for transport. 

## 2018-02-24 NOTE — ED Provider Notes (Signed)
[facility]   CSN: [id number] Arrival date & time: 02/23/18  1726   History   Chief Complaint Chief Complaint  Patient presents with  . Vaginal Bleeding    HPI Taylor Sherman is a 32 y.o. female who presents with vaginal bleeding. PMH significant for CIN grade 3 s/p LEEP on 6/13. She states she's had spotting for the past week but nothing substantial. Today she started having severe lower abdominal pain with associated vaginal bleeding. She's been passing multiple clots and had to change her pad about 5 times. The bleeding was not slowing down therefore she decided to come to the hospital. Nothing makes the pain better or worse. She is not on blood thinners. She no longer has periods.  HPI  Past Medical History:  Diagnosis Date  . Anxiety   . Asthma   . Hypertension   . PTSD (post-traumatic stress disorder)     Patient Active Problem List   Diagnosis Date Noted  . Cervical intraepithelial neoplasia grade III with severe dysplasia 02/15/2018    No past surgical history on file.   OB History   None      Home Medications    Prior to Admission medications   Medication Sig Start Date End Date Taking? Authorizing Provider  diphenhydramine-acetaminophen (TYLENOL PM) 25-500 MG TABS tablet Take 4 tablets by mouth at bedtime as needed.   Yes [provider]  DULoxetine (CYMBALTA) 30 MG capsule Take 30 mg by mouth as needed (anxiety).    Yes [provider]    Family History No family history on file.  Social History Social History   Tobacco Use  . Smoking status: Current Every Day Smoker  . Smokeless tobacco: Never Used  Substance Use Topics  . Alcohol use: No  . Drug use: No     Allergies   Patient has no known allergies.   Review of Systems Review of Systems  Constitutional: Negative for fever.  Gastrointestinal: Positive for abdominal pain. Negative for nausea and vomiting.    Genitourinary: Positive for vaginal bleeding, vaginal discharge and vaginal pain. Negative for dysuria.  All other systems reviewed and are negative.    Physical Exam Updated Vital Signs BP (!) 169/105   Pulse 71   Temp 98.9 F (37.2 C) (Oral)   Resp 16   SpO2 100%   Physical Exam  Constitutional: She is oriented to person, place, and time. She appears well-developed and well-nourished. No distress.  HENT:  Head: Normocephalic and atraumatic.  Eyes: Pupils are equal, round, and reactive to light. Conjunctivae are normal. Right eye exhibits no discharge. Left eye exhibits no discharge. No scleral icterus.  Neck: Normal range of motion.  Cardiovascular: Normal rate and regular rhythm.  Pulmonary/Chest: Effort normal and breath sounds normal. No respiratory distress.  Abdominal: Soft. Bowel sounds are normal. She exhibits no distension. There is tenderness (lower abdominal tenderness).  Genitourinary:  Genitourinary Comments: Pelvic: No inguinal lymphadenopathy or inguinal hernia noted. Normal external genitalia. Severe pain with speculum insertion. Cervix is s/p LEEP. There is brisk bleeding from cervix with clots in the vaginal vault.  Chaperone present during exam.    Neurological: She is alert and oriented to person, place, and time.  Skin: Skin is warm and dry.  Psychiatric: She has a normal mood and affect. Her behavior is normal.  Nursing note and vitals reviewed.    ED Treatments / Results  Labs (all labs ordered are listed, but only  abnormal results are displayed) Labs Reviewed  URINALYSIS, ROUTINE W REFLEX MICROSCOPIC - Abnormal; Notable for the following components:      Result Value   APPearance HAZY (*)    Hgb urine dipstick MODERATE (*)    Bilirubin Urine SMALL (*)    Protein, ur 30 (*)    Leukocytes, UA SMALL (*)    RBC / HPF >50 (*)    Bacteria, UA RARE (*)    All other components within normal limits  CBC WITH DIFFERENTIAL/PLATELET  BASIC METABOLIC  PANEL  I-STAT BETA HCG BLOOD, ED ([facility], [facility], [facility] ONLY)    EKG None  Radiology No results found.  Procedures Procedures (including critical care time)  Medications Ordered in ED Medications  oxyCODONE-acetaminophen (PERCOCET/ROXICET) 5-325 MG per tablet 1 tablet (1 tablet Oral Given 02/24/18 0111)  ferric subsulfate (MONSEL'S) solution 1 application (1 application Topical Given 02/24/18 0141)  HYDROmorphone (DILAUDID) injection 1 mg (1 mg Intramuscular Given 02/24/18 0332)     Initial Impression / Assessment and Plan / ED Course  I have reviewed the triage vital signs and the nursing notes.  Pertinent labs & imaging results that were available during my care of the patient were reviewed by me and considered in my medical decision making (see chart for details).  32 year old female who is having acute onset of lower abdominal pain and vaginal bleeding since earlier today. She is hypertensive but otherwise vitals are normal. Labs are unremarkable. UA has moderate hgb but does not look infected. Discussed with Dr. Emelda Fear with OBGYN who recommends trying Monisel solution to stop bleeding and reassessment.   After Monisel the patient was observed for several hours. She is still passing clots. Discussed with Dr. Emelda Fear again who recommends transfer to [facility].  Final Clinical Impressions(s) / ED Diagnoses   Final diagnoses:  Vaginal bleeding    ED Discharge Orders    None       Bethel Born, PA-C 02/24/18 0513    Melene Plan, DO 02/24/18 [phone]

## 2018-02-24 NOTE — ED Notes (Signed)
Pt departing with [facility] at this time. Pt alert and oriented in NAD. Pain mediation given prior to departure. Belongings with patient.

## 2018-02-26 MED FILL — Ferric Subsulfate Soln: CUTANEOUS | Qty: 8 | Status: AC

## 2018-02-28 ENCOUNTER — Emergency Department (HOSPITAL_COMMUNITY): Payer: Medicaid Other

## 2018-02-28 ENCOUNTER — Emergency Department (HOSPITAL_COMMUNITY)
Admission: EM | Admit: 2018-02-28 | Discharge: 2018-02-28 | Disposition: A | Discharge status: Home / Self Care | Payer: Medicaid Other | Attending: Emergency Medicine | Admitting: Emergency Medicine

## 2018-02-28 ENCOUNTER — Encounter (HOSPITAL_COMMUNITY): Payer: Self-pay

## 2018-02-28 DIAGNOSIS — Y999 Unspecified external cause status: Secondary | ICD-10-CM | POA: Diagnosis not present

## 2018-02-28 DIAGNOSIS — J45909 Unspecified asthma, uncomplicated: Secondary | ICD-10-CM | POA: Insufficient documentation

## 2018-02-28 DIAGNOSIS — Y939 Activity, unspecified: Secondary | ICD-10-CM | POA: Insufficient documentation

## 2018-02-28 DIAGNOSIS — I10 Essential (primary) hypertension: Secondary | ICD-10-CM | POA: Insufficient documentation

## 2018-02-28 DIAGNOSIS — H05231 Hemorrhage of right orbit: Secondary | ICD-10-CM

## 2018-02-28 DIAGNOSIS — S069X1A Unspecified intracranial injury with loss of consciousness of 30 minutes or less, initial encounter: Secondary | ICD-10-CM | POA: Diagnosis not present

## 2018-02-28 DIAGNOSIS — Z23 Encounter for immunization: Secondary | ICD-10-CM | POA: Diagnosis not present

## 2018-02-28 DIAGNOSIS — Z87891 Personal history of nicotine dependence: Secondary | ICD-10-CM | POA: Insufficient documentation

## 2018-02-28 DIAGNOSIS — S0502XA Injury of conjunctiva and corneal abrasion without foreign body, left eye, initial encounter: Secondary | ICD-10-CM | POA: Insufficient documentation

## 2018-02-28 DIAGNOSIS — Y929 Unspecified place or not applicable: Secondary | ICD-10-CM | POA: Diagnosis not present

## 2018-02-28 DIAGNOSIS — R0789 Other chest pain: Secondary | ICD-10-CM | POA: Insufficient documentation

## 2018-02-28 DIAGNOSIS — R109 Unspecified abdominal pain: Secondary | ICD-10-CM | POA: Diagnosis not present

## 2018-02-28 DIAGNOSIS — M79671 Pain in right foot: Secondary | ICD-10-CM | POA: Diagnosis not present

## 2018-02-28 DIAGNOSIS — H209 Unspecified iridocyclitis: Secondary | ICD-10-CM | POA: Insufficient documentation

## 2018-02-28 DIAGNOSIS — S098XXA Other specified injuries of head, initial encounter: Secondary | ICD-10-CM | POA: Diagnosis present

## 2018-02-28 DIAGNOSIS — S0083XA Contusion of other part of head, initial encounter: Secondary | ICD-10-CM

## 2018-02-28 LAB — I-STAT CHEM 8, ED
BUN: 9 mg/dL (ref 6–20)
Calcium, Ion: 1.1 mmol/L — ABNORMAL LOW (ref 1.15–1.40)
Chloride: 109 mmol/L (ref 98–111)
Creatinine, Ser: 1.3 mg/dL — ABNORMAL HIGH (ref 0.44–1.00)
Glucose, Bld: 97 mg/dL (ref 70–99)
HEMATOCRIT: 44 % (ref 36.0–46.0)
HEMOGLOBIN: 15 g/dL (ref 12.0–15.0)
POTASSIUM: 3.3 mmol/L — AB (ref 3.5–5.1)
SODIUM: 146 mmol/L — AB (ref 135–145)
TCO2: 17 mmol/L — AB (ref 22–32)

## 2018-02-28 LAB — I-STAT BETA HCG BLOOD, ED (MC, WL, AP ONLY)

## 2018-02-28 MED ORDER — HYDROMORPHONE HCL 1 MG/ML IJ SOLN
1.0000 mg | Freq: Once | INTRAMUSCULAR | Status: AC
  Administered 2018-02-28: 1 mg via INTRAVENOUS
  Filled 2018-02-28: qty 1

## 2018-02-28 MED ORDER — BACITRACIN ZINC 500 UNIT/GM EX OINT: 1.0000 "application " | TOPICAL_OINTMENT | Freq: Two times a day (BID) | CUTANEOUS | 0 refills | Status: DC

## 2018-02-28 MED ORDER — TETRACAINE HCL 0.5 % OP SOLN
2.0000 [drp] | Freq: Once | OPHTHALMIC | Status: AC
  Administered 2018-02-28: 2 [drp] via OPHTHALMIC
  Filled 2018-02-28: qty 4

## 2018-02-28 MED ORDER — IBUPROFEN 600 MG PO TABS: 600.0000 mg | ORAL_TABLET | Freq: Four times a day (QID) | ORAL | 0 refills | Status: DC | PRN

## 2018-02-28 MED ORDER — IOHEXOL 300 MG/ML  SOLN
100.0000 mL | Freq: Once | INTRAMUSCULAR | Status: AC | PRN
  Administered 2018-02-28: 100 mL via INTRAVENOUS

## 2018-02-28 MED ORDER — ACETAMINOPHEN 325 MG PO TABS: 650.0000 mg | ORAL_TABLET | Freq: Four times a day (QID) | ORAL | 0 refills | Status: DC | PRN

## 2018-02-28 MED ORDER — BACITRACIN ZINC 500 UNIT/GM EX OINT
TOPICAL_OINTMENT | Freq: Two times a day (BID) | CUTANEOUS | Status: DC
  Administered 2018-02-28: 1 via TOPICAL

## 2018-02-28 MED ORDER — LACTATED RINGERS IV BOLUS
1000.0000 mL | Freq: Once | INTRAVENOUS | Status: AC
  Administered 2018-02-28: 1000 mL via INTRAVENOUS

## 2018-02-28 MED ORDER — GATIFLOXACIN 0.5 % OP SOLN
1.0000 [drp] | Freq: Once | OPHTHALMIC | Status: AC
  Administered 2018-02-28: 1 [drp] via OPHTHALMIC
  Filled 2018-02-28 (×2): qty 2.5

## 2018-02-28 MED ORDER — FLUORESCEIN SODIUM 1 MG OP STRP
1.0000 | ORAL_STRIP | Freq: Once | OPHTHALMIC | Status: AC
  Administered 2018-02-28: 1 via OPHTHALMIC
  Filled 2018-02-28 (×2): qty 1

## 2018-02-28 MED ORDER — TETANUS-DIPHTH-ACELL PERTUSSIS 5-2.5-18.5 LF-MCG/0.5 IM SUSP
0.5000 mL | Freq: Once | INTRAMUSCULAR | Status: AC
  Administered 2018-02-28: 0.5 mL via INTRAMUSCULAR
  Filled 2018-02-28: qty 0.5

## 2018-02-28 NOTE — ED Notes (Signed)
Patient transported to CT 

## 2018-02-28 NOTE — ED Notes (Signed)
ED Provider at bedside. 

## 2018-02-28 NOTE — ED Notes (Signed)
Patient transported to X-ray 
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
## 2018-03-01 ENCOUNTER — Encounter (HOSPITAL_COMMUNITY): Payer: Self-pay | Admitting: Emergency Medicine

## 2018-03-01 ENCOUNTER — Emergency Department (HOSPITAL_COMMUNITY): Payer: Medicaid Other

## 2018-03-01 ENCOUNTER — Emergency Department (HOSPITAL_COMMUNITY)
Admission: EM | Admit: 2018-03-01 | Discharge: 2018-03-01 | Disposition: A | Discharge status: Home / Self Care | Payer: Medicaid Other | Attending: Emergency Medicine | Admitting: Emergency Medicine

## 2018-03-01 DIAGNOSIS — J45909 Unspecified asthma, uncomplicated: Secondary | ICD-10-CM | POA: Insufficient documentation

## 2018-03-01 DIAGNOSIS — Z79899 Other long term (current) drug therapy: Secondary | ICD-10-CM | POA: Insufficient documentation

## 2018-03-01 DIAGNOSIS — Y9389 Activity, other specified: Secondary | ICD-10-CM | POA: Diagnosis not present

## 2018-03-01 DIAGNOSIS — I10 Essential (primary) hypertension: Secondary | ICD-10-CM | POA: Diagnosis not present

## 2018-03-01 DIAGNOSIS — Z87891 Personal history of nicotine dependence: Secondary | ICD-10-CM | POA: Insufficient documentation

## 2018-03-01 DIAGNOSIS — Y929 Unspecified place or not applicable: Secondary | ICD-10-CM | POA: Insufficient documentation

## 2018-03-01 DIAGNOSIS — F419 Anxiety disorder, unspecified: Secondary | ICD-10-CM | POA: Insufficient documentation

## 2018-03-01 DIAGNOSIS — S8001XA Contusion of right knee, initial encounter: Secondary | ICD-10-CM | POA: Diagnosis not present

## 2018-03-01 DIAGNOSIS — Y998 Other external cause status: Secondary | ICD-10-CM | POA: Diagnosis not present

## 2018-03-01 DIAGNOSIS — T07XXXA Unspecified multiple injuries, initial encounter: Secondary | ICD-10-CM

## 2018-03-01 DIAGNOSIS — S8991XA Unspecified injury of right lower leg, initial encounter: Secondary | ICD-10-CM | POA: Diagnosis present
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
## 2018-03-01 NOTE — Discharge Instructions (Addendum)
Return if any problems.
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
## 2018-10-25 ENCOUNTER — Encounter (HOSPITAL_COMMUNITY): Payer: Self-pay

## 2018-10-25 ENCOUNTER — Emergency Department (HOSPITAL_COMMUNITY)
Admission: EM | Admit: 2018-10-25 | Discharge: 2018-10-25 | Disposition: A | Discharge status: Home / Self Care | Payer: Medicaid Other | Attending: Emergency Medicine | Admitting: Emergency Medicine

## 2018-10-25 DIAGNOSIS — J45909 Unspecified asthma, uncomplicated: Secondary | ICD-10-CM | POA: Insufficient documentation

## 2018-10-25 DIAGNOSIS — Z87891 Personal history of nicotine dependence: Secondary | ICD-10-CM | POA: Insufficient documentation

## 2018-10-25 DIAGNOSIS — I10 Essential (primary) hypertension: Secondary | ICD-10-CM | POA: Diagnosis not present

## 2018-10-25 DIAGNOSIS — Z79899 Other long term (current) drug therapy: Secondary | ICD-10-CM | POA: Diagnosis not present

## 2018-10-25 DIAGNOSIS — R05 Cough: Secondary | ICD-10-CM | POA: Diagnosis present

## 2018-10-25 DIAGNOSIS — J209 Acute bronchitis, unspecified: Secondary | ICD-10-CM | POA: Insufficient documentation

## 2018-10-25 MED ORDER — AZITHROMYCIN 250 MG PO TABS: 250.0000 mg | ORAL_TABLET | Freq: Every day | ORAL | 0 refills | Status: DC

## 2018-10-25 MED ORDER — BENZONATATE 100 MG PO CAPS: 100.0000 mg | ORAL_CAPSULE | Freq: Three times a day (TID) | ORAL | 0 refills | Status: DC

## 2018-10-25 NOTE — ED Triage Notes (Signed)
Pt presents with onset of cough that began last night followed by body aches and headache this morning. Pt reports chills, unable to get warm; pt reports multiple co-workers with same symptoms.

## 2018-10-25 NOTE — ED Notes (Signed)
See providers notes

## 2018-10-25 NOTE — ED Provider Notes (Signed)
[facility] EMERGENCY DEPARTMENT Provider Note   CSN: [id number] Arrival date & time: 10/25/18  1445    History   Chief Complaint Chief Complaint  Patient presents with  . Cough    HPI Taylor Sherman is a 33 y.o. female.     HPI Patient is a 33 year old female with a history of asthma who presents to the emergency department with complaints of cough and body ache and sore throat since last night.  She is a low-grade fever today.  She has mild headache.  No chest or abdominal pain.  Denies nausea vomiting diarrhea.  She reports multiple coworkers with similar symptoms   Past Medical History:  Diagnosis Date  . Anxiety   . Asthma   . Hypertension   . PTSD (post-traumatic stress disorder)     Patient Active Problem List   Diagnosis Date Noted  . Chronic hypertension 02/24/2018  . Cervical intraepithelial neoplasia grade III with severe dysplasia 02/15/2018    Past Surgical History:  Procedure Laterality Date  . LEEP       OB History    Gravida  5   Para  3   Term  2   Preterm  1   AB  2   Living  3     SAB  2   TAB      Ectopic      Multiple      Live Births               Home Medications    Prior to Admission medications   Medication Sig Start Date End Date Taking? Authorizing Provider  acetaminophen (TYLENOL) 325 MG tablet Take 2 tablets (650 mg total) by mouth every 6 (six) hours as needed. 02/28/18   Derwood Kaplan, MD  amLODipine (NORVASC) 10 MG tablet Take 1 tablet (10 mg total) by mouth daily. 02/24/18   Calvert Cantor, CNM  azithromycin (ZITHROMAX) 250 MG tablet Take 1 tablet (250 mg total) by mouth daily. Take first 2 tablets together, then 1 every day until finished. 10/25/18   Azalia Bilis, MD  bacitracin ointment Apply 1 application topically 2 (two) times daily. 02/28/18   Derwood Kaplan, MD  benzonatate (TESSALON) 100 MG capsule Take 1 capsule (100 mg total) by mouth every 8 (eight) hours. 10/25/18    Azalia Bilis, MD  diphenhydramine-acetaminophen (TYLENOL PM) 25-500 MG TABS tablet Take 4 tablets by mouth at bedtime as needed.    [provider]  ibuprofen (ADVIL,MOTRIN) 600 MG tablet Take 1 tablet (600 mg total) by mouth every 6 (six) hours as needed. 02/28/18   Derwood Kaplan, MD    Family History History reviewed. No pertinent family history.  Social History Social History   Tobacco Use  . Smoking status: Former Smoker    Last attempt to quit: 2019    Years since quitting: 1.1  . Smokeless tobacco: Never Used  Substance Use Topics  . Alcohol use: Yes    Comment: Occasional  . Drug use: No     Allergies   Patient has no known allergies.   Review of Systems Review of Systems  All other systems reviewed and are negative.    Physical Exam Updated Vital Signs BP (!) 168/109 (BP Location: Right Arm)   Pulse 82   Temp 100 F (37.8 C) (Oral)   Resp 16   Ht 5\' 2"  (1.575 m)   Wt 54.4 kg   SpO2 100%   BMI 21.95 kg/m  Physical Exam Vitals signs and nursing note reviewed.  Constitutional:      General: She is not in acute distress.    Appearance: She is well-developed.  HENT:     Head: Normocephalic and atraumatic.  Neck:     Musculoskeletal: Normal range of motion.  Cardiovascular:     Rate and Rhythm: Normal rate and regular rhythm.     Heart sounds: Normal heart sounds.  Pulmonary:     Effort: Pulmonary effort is normal.     Breath sounds: Normal breath sounds.  Abdominal:     General: There is no distension.     Palpations: Abdomen is soft.     Tenderness: There is no abdominal tenderness.  Musculoskeletal: Normal range of motion.  Skin:    General: Skin is warm and dry.  Neurological:     Mental Status: She is alert and oriented to person, place, and time.  Psychiatric:        Judgment: Judgment normal.      ED Treatments / Results  Labs (all labs ordered are listed, but only abnormal results are displayed) Labs Reviewed - No  data to display  EKG None  Radiology No results found.  Procedures Procedures (including critical care time)  Medications Ordered in ED Medications - No data to display   Initial Impression / Assessment and Plan / ED Course  I have reviewed the triage vital signs and the nursing notes.  Pertinent labs & imaging results that were available during my care of the patient were reviewed by me and considered in my medical decision making (see chart for details).        Patient is stable for discharge.  Home with treatment for acute bronchitis.  Primary care follow-up  Final Clinical Impressions(s) / ED Diagnoses   Final diagnoses:  Acute bronchitis, unspecified organism    ED Discharge Orders         Ordered    azithromycin (ZITHROMAX) 250 MG tablet  Daily     10/25/18 1513    benzonatate (TESSALON) 100 MG capsule  Every 8 hours     10/25/18 1513           Azalia Bilis, MD 10/25/18 1517

## 2018-10-25 NOTE — ED Notes (Signed)
Patient verbalizes understanding of discharge instructions. Opportunity for questioning and answers were provided. Armband removed by staff, pt discharged from ED home via POV.  

## 2019-02-03 ENCOUNTER — Other Ambulatory Visit: Payer: Self-pay

## 2019-02-03 ENCOUNTER — Emergency Department (HOSPITAL_COMMUNITY): Payer: Medicaid Other

## 2019-02-03 ENCOUNTER — Emergency Department (HOSPITAL_COMMUNITY)
Admission: EM | Admit: 2019-02-03 | Discharge: 2019-02-03 | Disposition: A | Discharge status: Home / Self Care | Payer: Medicaid Other | Attending: Emergency Medicine | Admitting: Emergency Medicine

## 2019-02-03 ENCOUNTER — Encounter (HOSPITAL_COMMUNITY): Payer: Self-pay | Admitting: Emergency Medicine

## 2019-02-03 DIAGNOSIS — Z87891 Personal history of nicotine dependence: Secondary | ICD-10-CM | POA: Insufficient documentation

## 2019-02-03 DIAGNOSIS — S4992XA Unspecified injury of left shoulder and upper arm, initial encounter: Secondary | ICD-10-CM | POA: Diagnosis present

## 2019-02-03 DIAGNOSIS — J45909 Unspecified asthma, uncomplicated: Secondary | ICD-10-CM | POA: Insufficient documentation

## 2019-02-03 DIAGNOSIS — S46912A Strain of unspecified muscle, fascia and tendon at shoulder and upper arm level, left arm, initial encounter: Secondary | ICD-10-CM | POA: Diagnosis not present

## 2019-02-03 DIAGNOSIS — Y9389 Activity, other specified: Secondary | ICD-10-CM | POA: Insufficient documentation

## 2019-02-03 DIAGNOSIS — Y929 Unspecified place or not applicable: Secondary | ICD-10-CM | POA: Insufficient documentation

## 2019-02-03 DIAGNOSIS — I10 Essential (primary) hypertension: Secondary | ICD-10-CM | POA: Insufficient documentation

## 2019-02-03 DIAGNOSIS — Z79899 Other long term (current) drug therapy: Secondary | ICD-10-CM | POA: Diagnosis not present

## 2019-02-03 DIAGNOSIS — Y999 Unspecified external cause status: Secondary | ICD-10-CM | POA: Diagnosis not present

## 2019-02-03 LAB — CBC WITH DIFFERENTIAL/PLATELET
Abs Immature Granulocytes: 0 10*3/uL (ref 0.00–0.07)
Basophils Absolute: 0 10*3/uL (ref 0.0–0.1)
Basophils Relative: 1 %
Eosinophils Absolute: 0 10*3/uL (ref 0.0–0.5)
Eosinophils Relative: 1 %
HCT: 42.8 % (ref 36.0–46.0)
Hemoglobin: 13.9 g/dL (ref 12.0–15.0)
Immature Granulocytes: 0 %
Lymphocytes Relative: 46 %
Lymphs Abs: 1.5 10*3/uL (ref 0.7–4.0)
MCH: 29.4 pg (ref 26.0–34.0)
MCHC: 32.5 g/dL (ref 30.0–36.0)
MCV: 90.7 fL (ref 80.0–100.0)
Monocytes Absolute: 0.3 10*3/uL (ref 0.1–1.0)
Monocytes Relative: 10 %
Neutro Abs: 1.4 10*3/uL — ABNORMAL LOW (ref 1.7–7.7)
Neutrophils Relative %: 42 %
Platelets: 295 10*3/uL (ref 150–400)
RBC: 4.72 MIL/uL (ref 3.87–5.11)
RDW: 13.9 % (ref 11.5–15.5)
WBC: 3.4 10*3/uL — ABNORMAL LOW (ref 4.0–10.5)
nRBC: 0 % (ref 0.0–0.2)

## 2019-02-03 LAB — COMPREHENSIVE METABOLIC PANEL
ALT: 18 U/L (ref 0–44)
AST: 25 U/L (ref 15–41)
Albumin: 4.2 g/dL (ref 3.5–5.0)
Alkaline Phosphatase: 51 U/L (ref 38–126)
Anion gap: 12 (ref 5–15)
BUN: 9 mg/dL (ref 6–20)
CO2: 20 mmol/L — ABNORMAL LOW (ref 22–32)
Calcium: 9.4 mg/dL (ref 8.9–10.3)
Chloride: 108 mmol/L (ref 98–111)
Creatinine, Ser: 0.76 mg/dL (ref 0.44–1.00)
GFR calc Af Amer: >60 mL/min (ref >60)
GFR calc non Af Amer: >60 mL/min (ref >60)
Glucose, Bld: 90 mg/dL (ref 70–99)
Potassium: 3.8 mmol/L (ref 3.5–5.1)
Sodium: 140 mmol/L (ref 135–145)
Total Bilirubin: 0.7 mg/dL (ref 0.3–1.2)
Total Protein: 6.8 g/dL (ref 6.5–8.1)

## 2019-02-03 LAB — TROPONIN I: Troponin I: <0.03 ng/mL (ref <0.03)

## 2019-02-03 LAB — I-STAT BETA HCG BLOOD, ED (MC, WL, AP ONLY): I-stat hCG, quantitative: <5 m[IU]/mL (ref <5)

## 2019-02-03 MED ORDER — MORPHINE SULFATE (PF) 2 MG/ML IV SOLN
2.0000 mg | Freq: Once | INTRAVENOUS | Status: AC
  Administered 2019-02-03: 19:00:00 2 mg via INTRAVENOUS
  Filled 2019-02-03: qty 1

## 2019-02-03 MED ORDER — METOCLOPRAMIDE HCL 5 MG/ML IJ SOLN
10.0000 mg | Freq: Once | INTRAMUSCULAR | Status: AC
  Administered 2019-02-03: 21:00:00 10 mg via INTRAVENOUS
  Filled 2019-02-03: qty 2

## 2019-02-03 MED ORDER — ONDANSETRON 4 MG PO TBDP
4.0000 mg | ORAL_TABLET | Freq: Once | ORAL | Status: AC
  Administered 2019-02-03: 17:00:00 4 mg via ORAL
  Filled 2019-02-03: qty 1

## 2019-02-03 MED ORDER — OXYCODONE-ACETAMINOPHEN 5-325 MG PO TABS
1.0000 | ORAL_TABLET | Freq: Once | ORAL | Status: AC
  Administered 2019-02-03: 1 via ORAL
  Filled 2019-02-03: qty 1

## 2019-02-03 MED ORDER — NAPROXEN 500 MG PO TABS: 500.0000 mg | ORAL_TABLET | Freq: Two times a day (BID) | ORAL | 0 refills | Status: DC
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
## 2019-02-03 NOTE — Discharge Instructions (Addendum)
Take naproxen 2 times a day with meals.  Do not take other anti-inflammatories at the same time (Advil, Motrin, ibuprofen, Aleve). You may supplement with Tylenol if you need further pain control. Use muscle creams (bengay, icy hot, salonpas, biofreeze) as needed for pain.  You will likely have continued pain for the next several days. Follow up with your primary care doctor if pain is not improving with this treatment in 2 weeks.  Return to the ER if you develop high fevers, numbness, your are dropping things with your arm, or any new or concerning symptoms.

## 2019-02-03 NOTE — ED Notes (Signed)
Patient transported to X-ray 
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
## 2019-02-03 NOTE — ED Notes (Signed)
Patient verbalizes understanding of discharge instructions. Opportunity for questioning and answers were provided. Armband removed by staff, pt discharged from ED.  
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
## 2019-11-14 ENCOUNTER — Ambulatory Visit (HOSPITAL_COMMUNITY)
Admission: EM | Admit: 2019-11-14 | Discharge: 2019-11-14 | Payer: Medicaid Other | Attending: Internal Medicine | Admitting: Internal Medicine

## 2019-11-14 ENCOUNTER — Emergency Department (HOSPITAL_COMMUNITY)
Admission: EM | Admit: 2019-11-14 | Discharge: 2019-11-14 | Disposition: A | Discharge status: Home / Self Care | Payer: Medicaid Other | Attending: Emergency Medicine | Admitting: Emergency Medicine

## 2019-11-14 ENCOUNTER — Other Ambulatory Visit: Payer: Self-pay

## 2019-11-14 ENCOUNTER — Encounter (HOSPITAL_COMMUNITY): Payer: Self-pay | Admitting: Emergency Medicine

## 2019-11-14 ENCOUNTER — Encounter (HOSPITAL_COMMUNITY): Payer: Self-pay | Admitting: Pediatrics

## 2019-11-14 DIAGNOSIS — R197 Diarrhea, unspecified: Secondary | ICD-10-CM | POA: Diagnosis not present

## 2019-11-14 DIAGNOSIS — R112 Nausea with vomiting, unspecified: Secondary | ICD-10-CM

## 2019-11-14 DIAGNOSIS — R6883 Chills (without fever): Secondary | ICD-10-CM | POA: Diagnosis not present

## 2019-11-14 DIAGNOSIS — U071 COVID-19: Secondary | ICD-10-CM | POA: Diagnosis not present

## 2019-11-14 DIAGNOSIS — I10 Essential (primary) hypertension: Secondary | ICD-10-CM | POA: Insufficient documentation

## 2019-11-14 DIAGNOSIS — J45909 Unspecified asthma, uncomplicated: Secondary | ICD-10-CM | POA: Insufficient documentation

## 2019-11-14 DIAGNOSIS — R519 Headache, unspecified: Secondary | ICD-10-CM | POA: Insufficient documentation

## 2019-11-14 DIAGNOSIS — Z20822 Contact with and (suspected) exposure to covid-19: Secondary | ICD-10-CM | POA: Insufficient documentation

## 2019-11-14 DIAGNOSIS — R10816 Epigastric abdominal tenderness: Secondary | ICD-10-CM | POA: Diagnosis not present

## 2019-11-14 DIAGNOSIS — R05 Cough: Secondary | ICD-10-CM

## 2019-11-14 DIAGNOSIS — Z87891 Personal history of nicotine dependence: Secondary | ICD-10-CM | POA: Insufficient documentation

## 2019-11-14 DIAGNOSIS — Z791 Long term (current) use of non-steroidal anti-inflammatories (NSAID): Secondary | ICD-10-CM | POA: Diagnosis not present

## 2019-11-14 DIAGNOSIS — R059 Cough, unspecified: Secondary | ICD-10-CM

## 2019-11-14 DIAGNOSIS — R52 Pain, unspecified: Secondary | ICD-10-CM

## 2019-11-14 DIAGNOSIS — B349 Viral infection, unspecified: Secondary | ICD-10-CM

## 2019-11-14 LAB — COMPREHENSIVE METABOLIC PANEL
ALT: 81 U/L — ABNORMAL HIGH (ref 0–44)
AST: 89 U/L — ABNORMAL HIGH (ref 15–41)
Albumin: 4.4 g/dL (ref 3.5–5.0)
Alkaline Phosphatase: 54 U/L (ref 38–126)
Anion gap: 14 (ref 5–15)
BUN: 10 mg/dL (ref 6–20)
CO2: 20 mmol/L — ABNORMAL LOW (ref 22–32)
Calcium: 9.3 mg/dL (ref 8.9–10.3)
Chloride: 107 mmol/L (ref 98–111)
Creatinine, Ser: 0.9 mg/dL (ref 0.44–1.00)
GFR calc Af Amer: >60 mL/min (ref >60)
GFR calc non Af Amer: >60 mL/min (ref >60)
Glucose, Bld: 84 mg/dL (ref 70–99)
Potassium: 4.1 mmol/L (ref 3.5–5.1)
Sodium: 141 mmol/L (ref 135–145)
Total Bilirubin: 0.7 mg/dL (ref 0.3–1.2)
Total Protein: 7.2 g/dL (ref 6.5–8.1)

## 2019-11-14 LAB — CBC
HCT: 46 % (ref 36.0–46.0)
Hemoglobin: 15.2 g/dL — ABNORMAL HIGH (ref 12.0–15.0)
MCH: 29 pg (ref 26.0–34.0)
MCHC: 33 g/dL (ref 30.0–36.0)
MCV: 87.6 fL (ref 80.0–100.0)
Platelets: 248 10*3/uL (ref 150–400)
RBC: 5.25 MIL/uL — ABNORMAL HIGH (ref 3.87–5.11)
RDW: 12.9 % (ref 11.5–15.5)
WBC: 3.6 10*3/uL — ABNORMAL LOW (ref 4.0–10.5)
nRBC: 0 % (ref 0.0–0.2)

## 2019-11-14 LAB — URINALYSIS, ROUTINE W REFLEX MICROSCOPIC
Bilirubin Urine: NEGATIVE
Glucose, UA: NEGATIVE mg/dL
Ketones, ur: NEGATIVE mg/dL
Leukocytes,Ua: NEGATIVE
Nitrite: NEGATIVE
Protein, ur: NEGATIVE mg/dL
Specific Gravity, Urine: 1.017 (ref 1.005–1.030)
pH: 6 (ref 5.0–8.0)

## 2019-11-14 LAB — POCT RAPID STREP A: Streptococcus, Group A Screen (Direct): NEGATIVE

## 2019-11-14 LAB — I-STAT BETA HCG BLOOD, ED (MC, WL, AP ONLY): I-stat hCG, quantitative: <5 m[IU]/mL (ref <5)

## 2019-11-14 LAB — LIPASE, BLOOD: Lipase: 24 U/L (ref 11–51)

## 2019-11-14 MED ORDER — ONDANSETRON 4 MG PO TBDP
4.0000 mg | ORAL_TABLET | Freq: Once | ORAL | Status: AC
  Administered 2019-11-14: 16:00:00 4 mg via ORAL

## 2019-11-14 MED ORDER — ONDANSETRON HCL 4 MG PO TABS: 4.0000 mg | ORAL_TABLET | Freq: Four times a day (QID) | ORAL | 0 refills | Status: DC

## 2019-11-14 MED ORDER — SODIUM CHLORIDE 0.9% FLUSH: 3.0000 mL | Freq: Once | INTRAVENOUS | Status: DC

## 2019-11-14 MED ORDER — ONDANSETRON 4 MG PO TBDP
ORAL_TABLET | ORAL | Status: AC
  Filled 2019-11-14: qty 1

## 2019-11-14 MED ORDER — DIPHENHYDRAMINE HCL 50 MG/ML IJ SOLN
25.0000 mg | Freq: Once | INTRAMUSCULAR | Status: AC
  Administered 2019-11-14: 21:00:00 25 mg via INTRAVENOUS
  Filled 2019-11-14: qty 1

## 2019-11-14 MED ORDER — SODIUM CHLORIDE 0.9 % IV BOLUS
1000.0000 mL | Freq: Once | INTRAVENOUS | Status: AC
  Administered 2019-11-14: 1000 mL via INTRAVENOUS

## 2019-11-14 MED ORDER — PROCHLORPERAZINE EDISYLATE 10 MG/2ML IJ SOLN
10.0000 mg | Freq: Once | INTRAMUSCULAR | Status: AC
  Administered 2019-11-14: 10 mg via INTRAVENOUS
  Filled 2019-11-14: qty 2

## 2019-11-14 NOTE — ED Provider Notes (Signed)
[facility]    CSN: [id number] Arrival date & time: 11/14/19  1447      History   Chief Complaint Chief Complaint  Patient presents with  . Headache  . Emesis    HPI Taylor Sherman is a 34 y.o. female.   Patient reports nausea, vomiting, headache, sore throat, cough, body aches, chills since last night.  Has made no attempts to treat at home.  Reports she has no appetite, and that she is trying to drink, but cannot keep anything down.  Denies sick contacts.  Medical history significant for asthma and hypertension, anxiety, PTSD.   ROS per HPI  The history is provided by the patient.  Headache Associated symptoms: vomiting   Emesis Associated symptoms: headaches     Past Medical History:  Diagnosis Date  . Anxiety   . Asthma   . Hypertension   . PTSD (post-traumatic stress disorder)     Patient Active Problem List   Diagnosis Date Noted  . Chronic hypertension 02/24/2018  . Cervical intraepithelial neoplasia grade III with severe dysplasia 02/15/2018    Past Surgical History:  Procedure Laterality Date  . LEEP      OB History    Gravida  5   Para  3   Term  2   Preterm  1   AB  2   Living  3     SAB  2   TAB      Ectopic      Multiple      Live Births               Home Medications    Prior to Admission medications   Medication Sig Start Date End Date Taking? Authorizing Provider  naproxen (NAPROSYN) 500 MG tablet Take 1 tablet (500 mg total) by mouth 2 (two) times daily with a meal. 02/03/19   Caccavale, Sophia, PA-C  ondansetron (ZOFRAN) 4 MG tablet Take 1 tablet (4 mg total) by mouth every 6 (six) hours. 11/14/19   Moshe Cipro, NP    Family History No family history on file.  Social History Social History   Tobacco Use  . Smoking status: Former Smoker    Quit date: 2019    Years since quitting: 2.1  . Smokeless tobacco: Never Used  Substance Use Topics  . Alcohol use: Yes    Comment: Occasional  .  Drug use: No     Allergies   Patient has no known allergies.   Review of Systems Review of Systems  Gastrointestinal: Positive for vomiting.  Neurological: Positive for headaches.     Physical Exam Triage Vital Signs ED Triage Vitals  Enc Vitals Group     BP 11/14/19 1552 (!) 155/95     Pulse Rate 11/14/19 1552 81     Resp 11/14/19 1552 (!) 24     Temp 11/14/19 1552 98.5 F (36.9 C)     Temp src --      SpO2 11/14/19 1552 100 %     Weight --      Height --      Head Circumference --      Peak Flow --      Pain Score 11/14/19 1553 8     Pain Loc --      Pain Edu? --      Excl. in GC? --    No data found.  Updated Vital Signs BP (!) 155/95   Pulse 81   Temp  98.5 F (36.9 C)   Resp (!) 24   LMP  (LMP Unknown)   SpO2 100%       Physical Exam Vitals and nursing note reviewed.  Constitutional:      General: She is not in acute distress.    Appearance: Normal appearance. She is well-developed and normal weight. She is ill-appearing.  HENT:     Head: Normocephalic and atraumatic.     Right Ear: Tympanic membrane normal.     Left Ear: Tympanic membrane normal.     Nose: Congestion and rhinorrhea present.     Mouth/Throat:     Mouth: Mucous membranes are moist.     Pharynx: Posterior oropharyngeal erythema present.  Eyes:     Conjunctiva/sclera: Conjunctivae normal.  Cardiovascular:     Rate and Rhythm: Normal rate and regular rhythm.     Heart sounds: Normal heart sounds. No murmur.  Pulmonary:     Effort: Pulmonary effort is normal. No respiratory distress.     Breath sounds: Normal breath sounds.  Abdominal:     General: There is no distension.     Palpations: Abdomen is soft. There is no mass.     Tenderness: There is no abdominal tenderness. There is no guarding or rebound.     Hernia: No hernia is present.  Musculoskeletal:        General: Normal range of motion.     Cervical back: Normal range of motion and neck supple.  Lymphadenopathy:      Cervical: Cervical adenopathy present.  Skin:    General: Skin is warm and dry.     Capillary Refill: Capillary refill takes less than 2 seconds.  Neurological:     General: No focal deficit present.     Mental Status: She is alert and oriented to person, place, and time.  Psychiatric:        Mood and Affect: Mood normal.        Behavior: Behavior normal.      UC Treatments / Results  Labs (all labs ordered are listed, but only abnormal results are displayed) Labs Reviewed  SARS CORONAVIRUS 2 (TAT 6-24 HRS)  POCT RAPID STREP A    EKG   Radiology No results found.  Procedures Procedures (including critical care time)  Medications Ordered in UC Medications  ondansetron (ZOFRAN-ODT) disintegrating tablet 4 mg (4 mg Oral Given by Other 11/14/19 1605)    Initial Impression / Assessment and Plan / UC Course  I have reviewed the triage vital signs and the nursing notes.  Pertinent labs & imaging results that were available during my care of the patient were reviewed by me and considered in my medical decision making (see chart for details).     Pharyngitis, nausea and vomiting, body aches, chills.  Patient is tachypneic in the office.  4 mg Zofran sublingual given to patient in office.  Sublingual Zofran 4 mg also called into patient's pharmacy.  Rapid strep in office negative.  Will culture and inform patient of results when they are back.  Patient now asking to go to the ER, will down in wheelchair. Final Clinical Impressions(s) / UC Diagnoses   Final diagnoses:  Nausea and vomiting, intractability of vomiting not specified, unspecified vomiting type  Chills  Cough  Body aches  Viral illness     Discharge Instructions     Your COVID test is pending.  You should self quarantine until your test result is back and is negative.  Take Tylenol as needed for fever or discomfort.  Rest and keep yourself hydrated.    Your strep test was negative in the office.  We will  culture the specimen and call you with results, will treat with antibiotics if positive.chill  Go to the emergency department if you develop high fever, shortness of breath, severe diarrhea, or other concerning symptoms.       ED Prescriptions    Medication Sig Dispense Auth. Provider   ondansetron (ZOFRAN) 4 MG tablet Take 1 tablet (4 mg total) by mouth every 6 (six) hours. 12 tablet Moshe Cipro, NP     PDMP not reviewed this encounter.   Moshe Cipro, NP 11/14/19 [phone]

## 2019-11-14 NOTE — ED Notes (Signed)
Discharge instructions including pain management, follow up care, and COVID quarantine instructions discussed with pt. Pt verbalized understanding with no questions at this time.

## 2019-11-14 NOTE — ED Notes (Signed)
Patient is being discharged from the Urgent Care Center and sent to the Emergency Department via wheelchair by staff. Per Provider Moshe Cipro, patient is stable but in need of higher level of care due to severe head pain . Patient is aware and verbalizes understanding of plan of care.   Vitals:   11/14/19 1552  BP: (!) 155/95  Pulse: 81  Resp: (!) 24  Temp: 98.5 F (36.9 C)  SpO2: 100%

## 2019-11-14 NOTE — ED Triage Notes (Signed)
Pt c/o nausea/vomiting since yesterday. Pt c/o headache. "sometimes I cant catch my breath".

## 2019-11-14 NOTE — Discharge Instructions (Signed)
Take tylenol 2 pills 4 times a day and motrin 4 pills 3 times a day.  Drink plenty of fluids.  Return for worsening shortness of breath, headache, confusion. Follow up with your family doctor.   

## 2019-11-14 NOTE — ED Triage Notes (Signed)
C/o headache +N/V x 6 days; reported was recently seen at urgent care and advised to come to ED for further eval.

## 2019-11-14 NOTE — ED Provider Notes (Signed)
[facility] EMERGENCY DEPARTMENT Provider Note   CSN: [id number] Arrival date & time: 11/14/19  1627     History Chief Complaint  Patient presents with  . Headache  . Nausea    Taylor Sherman is a 34 y.o. female.  34 yo F with a cc of nausea vomiting diarrhea and headache.  Going on for about 48 hours.  Went to urgent care and felt like she needed to come to the ED afterwards.  Got some Zofran in urgent care and has been able to tolerate a little bit of p.o. but nothing prior to that.  Has some mild left-sided pain with coughing.  Unsure of sick contacts.  The history is provided by the patient.  Headache Pain location:  Generalized Quality:  Dull Radiates to:  Does not radiate Severity currently:  8/10 Severity at highest:  8/10 Onset quality:  Gradual Duration:  2 days Timing:  Constant Progression:  Worsening Chronicity:  New Similar to prior headaches: no   Relieved by:  Nothing Worsened by:  Nothing Associated symptoms: no congestion, no dizziness, no fever, no myalgias, no nausea and no vomiting        Past Medical History:  Diagnosis Date  . Anxiety   . Asthma   . Hypertension   . PTSD (post-traumatic stress disorder)     Patient Active Problem List   Diagnosis Date Noted  . Chronic hypertension 02/24/2018  . Cervical intraepithelial neoplasia grade III with severe dysplasia 02/15/2018    Past Surgical History:  Procedure Laterality Date  . LEEP       OB History    Gravida  5   Para  3   Term  2   Preterm  1   AB  2   Living  3     SAB  2   TAB      Ectopic      Multiple      Live Births              No family history on file.  Social History   Tobacco Use  . Smoking status: Former Smoker    Quit date: 2019    Years since quitting: 2.1  . Smokeless tobacco: Never Used  Substance Use Topics  . Alcohol use: Yes    Comment: Occasional  . Drug use: No    Home Medications Prior to Admission  medications   Medication Sig Start Date End Date Taking? Authorizing Provider  ibuprofen (ADVIL) 200 MG tablet Take 200 mg by mouth every 6 (six) hours as needed for moderate pain.   Yes [provider]  naproxen (NAPROSYN) 500 MG tablet Take 1 tablet (500 mg total) by mouth 2 (two) times daily with a meal. Patient not taking: Reported on 11/14/2019 02/03/19   Caccavale, Sophia, PA-C  ondansetron (ZOFRAN) 4 MG tablet Take 1 tablet (4 mg total) by mouth every 6 (six) hours. Patient not taking: Reported on 11/14/2019 11/14/19   Moshe Cipro, NP    Allergies    Patient has no known allergies.  Review of Systems   Review of Systems  Constitutional: Negative for chills and fever.  HENT: Negative for congestion and rhinorrhea.   Eyes: Negative for redness and visual disturbance.  Respiratory: Negative for shortness of breath and wheezing.   Cardiovascular: Negative for chest pain and palpitations.  Gastrointestinal: Negative for nausea and vomiting.  Genitourinary: Negative for dysuria and urgency.  Musculoskeletal: Negative for arthralgias and  myalgias.  Skin: Negative for pallor and wound.  Neurological: Positive for headaches. Negative for dizziness.    Physical Exam Updated Vital Signs BP (!) 142/87   Pulse 71   Temp 99.5 F (37.5 C) (Oral)   Resp 18   Ht 5\' 2"  (1.575 m)   Wt 59 kg   LMP  (LMP Unknown)   SpO2 97%   BMI 23.78 kg/m   Physical Exam Vitals and nursing note reviewed.  Constitutional:      General: She is not in acute distress.    Appearance: She is well-developed. She is not diaphoretic.  HENT:     Head: Normocephalic and atraumatic.  Eyes:     Pupils: Pupils are equal, round, and reactive to light.  Cardiovascular:     Rate and Rhythm: Normal rate and regular rhythm.     Heart sounds: No murmur. No friction rub. No gallop.   Pulmonary:     Effort: Pulmonary effort is normal.     Breath sounds: No wheezing or rales.  Abdominal:      General: There is no distension.     Palpations: Abdomen is soft.     Tenderness: There is abdominal tenderness.     Comments: Mild epigastric  Musculoskeletal:        General: No tenderness.     Cervical back: Normal range of motion and neck supple.  Skin:    General: Skin is warm and dry.  Neurological:     Mental Status: She is alert and oriented to person, place, and time.  Psychiatric:        Behavior: Behavior normal.     ED Results / Procedures / Treatments   Labs (all labs ordered are listed, but only abnormal results are displayed) Labs Reviewed  COMPREHENSIVE METABOLIC PANEL - Abnormal; Notable for the following components:      Result Value   CO2 20 (*)    AST 89 (*)    ALT 81 (*)    All other components within normal limits  CBC - Abnormal; Notable for the following components:   WBC 3.6 (*)    RBC 5.25 (*)    Hemoglobin 15.2 (*)    All other components within normal limits  URINALYSIS, ROUTINE W REFLEX MICROSCOPIC - Abnormal; Notable for the following components:   Hgb urine dipstick SMALL (*)    Bacteria, UA RARE (*)    All other components within normal limits  CULTURE, GROUP A STREP ([place])  LIPASE, BLOOD  I-STAT BETA HCG BLOOD, ED ([facility], [facility], [facility] ONLY)    EKG None  Radiology No results found.  Procedures Procedures (including critical care time)  Medications Ordered in ED Medications  sodium chloride flush (NS) 0.9 % injection 3 mL (3 mLs Intravenous Not Given 11/14/19 2109)  sodium chloride 0.9 % bolus 1,000 mL (0 mLs Intravenous Stopped 11/14/19 2222)  prochlorperazine (COMPAZINE) injection 10 mg (10 mg Intravenous Given 11/14/19 2110)  diphenhydrAMINE (BENADRYL) injection 25 mg (25 mg Intravenous Given 11/14/19 2110)    ED Course  I have reviewed the triage vital signs and the nursing notes.  Pertinent labs & imaging results that were available during my care of the patient were reviewed by me and considered in my medical decision making (see  chart for details).    MDM Rules/Calculators/A&P                      34 yo F with a chief complaint of  a headache nausea vomiting and diarrhea.  Most likely this is a viral syndrome.  Occurring during the novel coronavirus pandemic that could be a possible cause.  We will treat the patient symptomatically here.  Headache cocktail bolus of IV fluids.  Lab work without significant LFT elevation or lipase elevation.  Tolerating p.o.  Discharge home.  10:28 PM:  I have discussed the diagnosis/risks/treatment options with the patient and believe the pt to be eligible for discharge home to follow-up with PCP. We also discussed returning to the ED immediately if new or worsening sx occur. We discussed the sx which are most concerning (e.g., sudden worsening pain, fever, inability to tolerate by mouth) that necessitate immediate return. Medications administered to the patient during their visit and any new prescriptions provided to the patient are listed below.  Medications given during this visit Medications  sodium chloride flush (NS) 0.9 % injection 3 mL (3 mLs Intravenous Not Given 11/14/19 2109)  sodium chloride 0.9 % bolus 1,000 mL (0 mLs Intravenous Stopped 11/14/19 2222)  prochlorperazine (COMPAZINE) injection 10 mg (10 mg Intravenous Given 11/14/19 2110)  diphenhydrAMINE (BENADRYL) injection 25 mg (25 mg Intravenous Given 11/14/19 2110)     The patient appears reasonably screen and/or stabilized for discharge and I doubt any other medical condition or other [facility] requiring further screening, evaluation, or treatment in the ED at this time prior to discharge.   Final Clinical Impression(s) / ED Diagnoses Final diagnoses:  Suspected COVID-19 virus infection    Rx / DC Orders ED Discharge Orders    None       Melene Plan, DO 11/14/19 2228

## 2019-11-14 NOTE — Discharge Instructions (Addendum)
Your COVID test is pending.  You should self quarantine until your test result is back and is negative.    Take Tylenol as needed for fever or discomfort.  Rest and keep yourself hydrated.    Your strep test was negative in the office.  We will culture the specimen and call you with results, will treat with antibiotics if positive.  Go to there ER for futher eval and treatment.   Go to the emergency department if you develop high fever, shortness of breath, severe diarrhea, or other concerning symptoms.

## 2019-11-15 ENCOUNTER — Telehealth (HOSPITAL_COMMUNITY): Payer: Self-pay

## 2019-11-15 LAB — SARS CORONAVIRUS 2 (TAT 6-24 HRS): SARS Coronavirus 2: POSITIVE — AB

## 2019-11-15 NOTE — Telephone Encounter (Signed)
Results reviewed with pt. Discussed with pt. To self-isolate for 10 days from the onset of symptoms and to be 24 hrs free of fever and symptoms are improving to return  To work/community.  Return to the ED for any chest pain, difficulty breathing, resp. Distress and symptoms that are concerning .   Inform anyone you have had contact with.  All questions answered and pt. Verbalized understanding.

## 2019-11-17 LAB — CULTURE, GROUP A STREP (THRC)

## 2020-03-03 ENCOUNTER — Other Ambulatory Visit: Payer: Self-pay

## 2020-03-03 ENCOUNTER — Emergency Department (HOSPITAL_COMMUNITY)
Admission: EM | Admit: 2020-03-03 | Discharge: 2020-03-03 | Disposition: A | Discharge status: Home / Self Care | Payer: Medicaid Other | Attending: Emergency Medicine | Admitting: Emergency Medicine

## 2020-03-03 ENCOUNTER — Emergency Department (HOSPITAL_COMMUNITY): Payer: Medicaid Other

## 2020-03-03 ENCOUNTER — Encounter (HOSPITAL_COMMUNITY): Payer: Self-pay

## 2020-03-03 DIAGNOSIS — F1490 Cocaine use, unspecified, uncomplicated: Secondary | ICD-10-CM | POA: Insufficient documentation

## 2020-03-03 DIAGNOSIS — R112 Nausea with vomiting, unspecified: Secondary | ICD-10-CM | POA: Insufficient documentation

## 2020-03-03 DIAGNOSIS — R079 Chest pain, unspecified: Secondary | ICD-10-CM

## 2020-03-03 DIAGNOSIS — R0789 Other chest pain: Secondary | ICD-10-CM | POA: Diagnosis not present

## 2020-03-03 DIAGNOSIS — R42 Dizziness and giddiness: Secondary | ICD-10-CM | POA: Diagnosis not present

## 2020-03-03 DIAGNOSIS — H538 Other visual disturbances: Secondary | ICD-10-CM | POA: Insufficient documentation

## 2020-03-03 DIAGNOSIS — I10 Essential (primary) hypertension: Secondary | ICD-10-CM | POA: Diagnosis not present

## 2020-03-03 DIAGNOSIS — Z87891 Personal history of nicotine dependence: Secondary | ICD-10-CM | POA: Insufficient documentation

## 2020-03-03 LAB — CBC
HCT: 46.5 % — ABNORMAL HIGH (ref 36.0–46.0)
Hemoglobin: 15 g/dL (ref 12.0–15.0)
MCH: 29.3 pg (ref 26.0–34.0)
MCHC: 32.3 g/dL (ref 30.0–36.0)
MCV: 90.8 fL (ref 80.0–100.0)
Platelets: 303 10*3/uL (ref 150–400)
RBC: 5.12 MIL/uL — ABNORMAL HIGH (ref 3.87–5.11)
RDW: 13.8 % (ref 11.5–15.5)
WBC: 8 10*3/uL (ref 4.0–10.5)
nRBC: 0 % (ref 0.0–0.2)

## 2020-03-03 LAB — BASIC METABOLIC PANEL
Anion gap: 16 — ABNORMAL HIGH (ref 5–15)
BUN: 6 mg/dL (ref 6–20)
CO2: 20 mmol/L — ABNORMAL LOW (ref 22–32)
Calcium: 9.8 mg/dL (ref 8.9–10.3)
Chloride: 101 mmol/L (ref 98–111)
Creatinine, Ser: 0.67 mg/dL (ref 0.44–1.00)
GFR calc Af Amer: >60 mL/min (ref >60)
GFR calc non Af Amer: >60 mL/min (ref >60)
Glucose, Bld: 135 mg/dL — ABNORMAL HIGH (ref 70–99)
Potassium: 4 mmol/L (ref 3.5–5.1)
Sodium: 137 mmol/L (ref 135–145)

## 2020-03-03 LAB — TROPONIN I (HIGH SENSITIVITY)
Troponin I (High Sensitivity): 12 ng/L (ref <18)
Troponin I (High Sensitivity): 12 ng/L (ref <18)

## 2020-03-03 LAB — HEPATIC FUNCTION PANEL
ALT: 31 U/L (ref 0–44)
AST: 48 U/L — ABNORMAL HIGH (ref 15–41)
Albumin: 4.4 g/dL (ref 3.5–5.0)
Alkaline Phosphatase: 56 U/L (ref 38–126)
Bilirubin, Direct: 0.2 mg/dL (ref 0.0–0.2)
Indirect Bilirubin: 0.6 mg/dL (ref 0.3–0.9)
Total Bilirubin: 0.8 mg/dL (ref 0.3–1.2)
Total Protein: 7.8 g/dL (ref 6.5–8.1)

## 2020-03-03 LAB — I-STAT BETA HCG BLOOD, ED (MC, WL, AP ONLY): I-stat hCG, quantitative: <5 m[IU]/mL (ref <5)

## 2020-03-03 LAB — LIPASE, BLOOD: Lipase: 22 U/L (ref 11–51)

## 2020-03-03 MED ORDER — ONDANSETRON HCL 4 MG/2ML IJ SOLN
INTRAMUSCULAR | Status: AC
  Administered 2020-03-03: 4 mg via INTRAMUSCULAR
  Filled 2020-03-03: qty 2

## 2020-03-03 MED ORDER — AMLODIPINE BESYLATE 5 MG PO TABS: 5.0000 mg | ORAL_TABLET | Freq: Every day | ORAL | 0 refills | Status: DC

## 2020-03-03 MED ORDER — LORAZEPAM 2 MG/ML IJ SOLN
0.5000 mg | Freq: Once | INTRAMUSCULAR | Status: AC
  Administered 2020-03-03: 0.5 mg via INTRAVENOUS
  Filled 2020-03-03: qty 1

## 2020-03-03 MED ORDER — SODIUM CHLORIDE 0.9% FLUSH
3.0000 mL | Freq: Once | INTRAVENOUS | Status: AC
  Administered 2020-03-03: 3 mL via INTRAVENOUS

## 2020-03-03 MED ORDER — LACTATED RINGERS IV BOLUS
1000.0000 mL | Freq: Once | INTRAVENOUS | Status: AC
  Administered 2020-03-03: 1000 mL via INTRAVENOUS

## 2020-03-03 MED ORDER — ONDANSETRON HCL 4 MG/2ML IJ SOLN: 4.0000 mg | Freq: Once | INTRAMUSCULAR | Status: AC

## 2020-03-03 NOTE — ED Triage Notes (Signed)
Pt presents to ED actively vomiting, hitting her chest with her hand, states "chest hurst" "throwing up" hyperventilating in triage  Pt reports feeling dizzy when standing, blurred vision, chest tightness and feeling hot all over x1 hour. Pt reports using cocaine early this am, pt unsure of the time.

## 2020-03-03 NOTE — ED Notes (Signed)
Patient transported to CT 

## 2020-03-03 NOTE — Discharge Instructions (Signed)
Please stop using cocaine.   Today you received medications that may make you sleepy or impair your ability to make decisions.  For the next 24 hours please do not drive, operate heavy machinery, care for a small child with out another adult present, or perform any activities that may cause harm to you or someone else if you were to fall asleep or be impaired.
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
## 2020-03-09 ENCOUNTER — Ambulatory Visit (HOSPITAL_COMMUNITY)
Admission: EM | Admit: 2020-03-09 | Discharge: 2020-03-09 | Disposition: A | Discharge status: Home / Self Care | Payer: Medicaid Other | Attending: Family Medicine | Admitting: Family Medicine

## 2020-03-09 ENCOUNTER — Encounter (HOSPITAL_COMMUNITY): Payer: Self-pay

## 2020-03-09 ENCOUNTER — Other Ambulatory Visit: Payer: Self-pay

## 2020-03-09 DIAGNOSIS — M436 Torticollis: Secondary | ICD-10-CM

## 2020-03-09 DIAGNOSIS — S161XXA Strain of muscle, fascia and tendon at neck level, initial encounter: Secondary | ICD-10-CM | POA: Diagnosis not present

## 2020-03-09 DIAGNOSIS — I10 Essential (primary) hypertension: Secondary | ICD-10-CM

## 2020-03-09 MED ORDER — CYCLOBENZAPRINE HCL 5 MG PO TABS: 5.0000 mg | ORAL_TABLET | Freq: Two times a day (BID) | ORAL | 0 refills | Status: DC | PRN

## 2020-03-09 MED ORDER — DEXAMETHASONE SODIUM PHOSPHATE 10 MG/ML IJ SOLN
10.0000 mg | Freq: Once | INTRAMUSCULAR | Status: AC
  Administered 2020-03-09: 10 mg via INTRAMUSCULAR

## 2020-03-09 MED ORDER — DEXAMETHASONE SODIUM PHOSPHATE 10 MG/ML IJ SOLN
INTRAMUSCULAR | Status: AC
  Filled 2020-03-09: qty 1

## 2020-03-09 MED ORDER — AMLODIPINE BESYLATE 5 MG PO TABS
5.0000 mg | ORAL_TABLET | Freq: Every day | ORAL | 0 refills | Status: DC
Start: 2020-03-09 — End: 2020-06-03

## 2020-03-09 MED ORDER — KETOROLAC TROMETHAMINE 30 MG/ML IJ SOLN
30.0000 mg | Freq: Once | INTRAMUSCULAR | Status: AC
  Administered 2020-03-09: 30 mg via INTRAMUSCULAR

## 2020-03-09 MED ORDER — PREDNISONE 10 MG PO TABS
ORAL_TABLET | ORAL | 0 refills | Status: DC
Start: 2020-03-09 — End: 2020-05-19

## 2020-03-09 MED ORDER — KETOROLAC TROMETHAMINE 30 MG/ML IJ SOLN
INTRAMUSCULAR | Status: AC
  Filled 2020-03-09: qty 1

## 2020-03-09 NOTE — Discharge Instructions (Addendum)
Neck Pain We gave you toradol and decadron today Begin prednisone taper in the morning with food- decrease by 1 tablet each day until complete- 6,5,4,3,2,1 You may use flexeril as needed to help with pain. This is a muscle relaxer and causes sedation- please use only at bedtime or when you will be home and not have to drive/work Gentle neck stretches Alternate ice and heat  Blood Pressure Your blood pressure was elevated today in clinic. Please be sure to take blood pressure medications as prescribed. Please monitor your blood pressure at home or when you go to a [facility]. Please follow up with your primary care doctor to recheck blood pressure and discuss any need for medication changes.   Please go to Emergency Room if you start to experience severe headache, vision changes, decreased urine production, chest pain, shortness of breath, speech slurring, one sided weakness.

## 2020-03-09 NOTE — ED Notes (Signed)
BP reported to Dr. Nelson.  
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
## 2020-03-11 NOTE — ED Provider Notes (Signed)
[facility]    CSN: [id number] Arrival date & time: 03/09/20  1927      History   Chief Complaint Chief Complaint  Patient presents with  . Neck Pain    HPI Taylor Sherman is a 34 y.o. female history of hypertension presenting today for evaluation of neck pain.  Patient reports that approximately 1 week ago she had a panic attack and accidentally hit her body against a car.  She was evaluated in the emergency room afterward and had x-ray of chest as well as CT of head which was unremarkable.  Since she has had persistent and continued pain in her neck.  Reports pain with any movement of her left as well as arms.  Has been using Tylenol without relief.  Denies numbness or tingling.  Denies chest pain or shortness of breath.  Denies vision changes.  Has had associated headache at times which she feels radiate up into her head.  Denies dizziness or lightheadedness.  Of note blood pressure extremely elevated.  Patient previously on blood pressure medicine but is not at current.  HPI  Past Medical History:  Diagnosis Date  . Anxiety   . Asthma   . Hypertension   . PTSD (post-traumatic stress disorder)     Patient Active Problem List   Diagnosis Date Noted  . Chronic hypertension 02/24/2018  . Cervical intraepithelial neoplasia grade III with severe dysplasia 02/15/2018    Past Surgical History:  Procedure Laterality Date  . LEEP      OB History    Gravida  5   Para  3   Term  2   Preterm  1   AB  2   Living  3     SAB  2   TAB      Ectopic      Multiple      Live Births               Home Medications    Prior to Admission medications   Medication Sig Start Date End Date Taking? Authorizing Provider  amLODipine (NORVASC) 5 MG tablet Take 1 tablet (5 mg total) by mouth daily. 03/09/20   Eren Puebla C, PA-C  cyclobenzaprine (FLEXERIL) 5 MG tablet Take 1-2 tablets (5-10 mg total) by mouth 2 (two) times daily as needed for muscle spasms.  03/09/20   Zoeya Gramajo C, PA-C  ibuprofen (ADVIL) 200 MG tablet Take 200 mg by mouth every 6 (six) hours as needed for moderate pain.    [provider]  naproxen (NAPROSYN) 500 MG tablet Take 1 tablet (500 mg total) by mouth 2 (two) times daily with a meal. Patient not taking: Reported on 11/14/2019 02/03/19   Caccavale, Sophia, PA-C  ondansetron (ZOFRAN) 4 MG tablet Take 1 tablet (4 mg total) by mouth every 6 (six) hours. Patient not taking: Reported on 11/14/2019 11/14/19   Moshe Cipro, NP  predniSONE (DELTASONE) 10 MG tablet Begin with 6 tabs on day 1, 5 tab on day 2, 4 tab on day 3, 3 tab on day 4, 2 tab on day 5, 1 tab on day 6-take with food 03/09/20   Tyshika Baldridge, Junius Creamer, PA-C    Family History History reviewed. No pertinent family history.  Social History Social History   Tobacco Use  . Smoking status: Former Smoker    Quit date: 2019    Years since quitting: 2.5  . Smokeless tobacco: Never Used  Substance Use Topics  . Alcohol  use: Yes    Comment: Occasional  . Drug use: No     Allergies   Patient has no known allergies.   Review of Systems Review of Systems  Constitutional: Negative for fatigue and fever.  HENT: Negative for congestion, sinus pressure and sore throat.   Eyes: Negative for photophobia, pain and visual disturbance.  Respiratory: Negative for cough and shortness of breath.   Cardiovascular: Negative for chest pain.  Gastrointestinal: Negative for abdominal pain, nausea and vomiting.  Genitourinary: Negative for decreased urine volume and hematuria.  Musculoskeletal: Positive for myalgias and neck pain. Negative for neck stiffness.  Neurological: Positive for headaches. Negative for dizziness, syncope, facial asymmetry, speech difficulty, weakness, light-headedness and numbness.     Physical Exam Triage Vital Signs ED Triage Vitals  Enc Vitals Group     BP 03/09/20 2014 (!) 177/119     Pulse Rate 03/09/20 2014 68     Resp 03/09/20  2014 18     Temp 03/09/20 2014 98.6 F (37 C)     Temp Source 03/09/20 2014 Oral     SpO2 03/09/20 2014 97 %     Weight --      Height --      Head Circumference --      Peak Flow --      Pain Score 03/09/20 2013 7     Pain Loc --      Pain Edu? --      Excl. in [place]? --    No data found.  Updated Vital Signs BP (!) 177/119 (BP Location: Right Arm)   Pulse 68   Temp 98.6 F (37 C) (Oral)   Resp 18   LMP  (Within Weeks) Comment: 3 weeks  SpO2 97%   Visual Acuity Right Eye Distance:   Left Eye Distance:   Bilateral Distance:    Right Eye Near:   Left Eye Near:    Bilateral Near:     Physical Exam Vitals and nursing note reviewed.  Constitutional:      Appearance: She is well-developed.     Comments: No acute distress  HENT:     Head: Normocephalic and atraumatic.     Nose: Nose normal.  Eyes:     Conjunctiva/sclera: Conjunctivae normal.  Neck:     Comments: Holding neck in slightly awkward position, resisting movement Nontender to palpation along cervical spine midline, diffuse increased tenderness throughout right cervical and superior trapezius/thoracic area of shoulder/back Nontender about bony prominences of clavicle and scapular spine Cardiovascular:     Rate and Rhythm: Normal rate and regular rhythm.     Comments: Blood pressure rechecked and remains similarly elevated Pulmonary:     Effort: Pulmonary effort is normal. No respiratory distress.     Comments: Breathing comfortably at rest, CTABL, no wheezing, rales or other adventitious sounds auscultated  Abdominal:     General: There is no distension.  Musculoskeletal:        General: Normal range of motion.     Cervical back: Neck supple.  Skin:    General: Skin is warm and dry.  Neurological:     Mental Status: She is alert and oriented to person, place, and time.      UC Treatments / Results  Labs (all labs ordered are listed, but only abnormal results are displayed) Labs Reviewed - No data  to display  EKG   Radiology No results found.  Procedures Procedures (including critical care time)  Medications Ordered in  UC Medications  ketorolac (TORADOL) 30 MG/ML injection 30 mg (30 mg Intramuscular Given 03/09/20 2059)  dexamethasone (DECADRON) injection 10 mg (10 mg Intramuscular Given 03/09/20 2059)    Initial Impression / Assessment and Plan / UC Course  I have reviewed the triage vital signs and the nursing notes.  Pertinent labs & imaging results that were available during my care of the patient were reviewed by me and considered in my medical decision making (see chart for details).     1.  Neck pain-highly suspicious of muscular straining of cervical and superior thoracic musculature, has had prior CT emergency room unremarkable.  No midline tenderness.  Providing Toradol and Decadron prior to discharge and continuing on prednisone taper and muscle relaxers, gentle stretching  2.  Hypertension-remaining elevated, no red flags today, stressed importance of monitoring of blood pressure along with her symptoms, reinitiating on amlodipine 5 mg and recommended to monitor and follow-up with primary care for further monitoring and management of blood pressure.  Discussed strict return precautions. Patient verbalized understanding and is agreeable with plan.  Final Clinical Impressions(s) / UC Diagnoses   Final diagnoses:  Torticollis, acute  Acute strain of neck muscle, initial encounter  Essential hypertension     Discharge Instructions     Neck Pain We gave you toradol and decadron today Begin prednisone taper in the morning with food- decrease by 1 tablet each day until complete- 6,5,4,3,2,1 You may use flexeril as needed to help with pain. This is a muscle relaxer and causes sedation- please use only at bedtime or when you will be home and not have to drive/work Gentle neck stretches Alternate ice and heat  Blood Pressure Your blood pressure was elevated today  in clinic. Please be sure to take blood pressure medications as prescribed. Please monitor your blood pressure at home or when you go to a [facility]. Please follow up with your primary care doctor to recheck blood pressure and discuss any need for medication changes.   Please go to Emergency Room if you start to experience severe headache, vision changes, decreased urine production, chest pain, shortness of breath, speech slurring, one sided weakness.   ED Prescriptions    Medication Sig Dispense Auth. Provider   predniSONE (DELTASONE) 10 MG tablet Begin with 6 tabs on day 1, 5 tab on day 2, 4 tab on day 3, 3 tab on day 4, 2 tab on day 5, 1 tab on day 6-take with food 21 tablet Shaketa Serafin C, PA-C   cyclobenzaprine (FLEXERIL) 5 MG tablet Take 1-2 tablets (5-10 mg total) by mouth 2 (two) times daily as needed for muscle spasms. 24 tablet Brenly Trawick C, PA-C   amLODipine (NORVASC) 5 MG tablet Take 1 tablet (5 mg total) by mouth daily. 30 tablet Garen Woolbright, [place] C, PA-C     PDMP not reviewed this encounter.   Lew Dawes, [place] 03/11/20 1744

## 2020-05-19 ENCOUNTER — Ambulatory Visit (INDEPENDENT_AMBULATORY_CARE_PROVIDER_SITE_OTHER): Payer: Medicaid Other

## 2020-05-19 ENCOUNTER — Other Ambulatory Visit: Payer: Self-pay

## 2020-05-19 VITALS — BP 143/96 | HR 60 | Ht 62.0 in | Wt 124.8 lb

## 2020-05-19 DIAGNOSIS — Z789 Other specified health status: Secondary | ICD-10-CM | POA: Diagnosis not present

## 2020-05-19 DIAGNOSIS — N912 Amenorrhea, unspecified: Secondary | ICD-10-CM

## 2020-05-19 DIAGNOSIS — O3680X0 Pregnancy with inconclusive fetal viability, not applicable or unspecified: Secondary | ICD-10-CM | POA: Diagnosis not present

## 2020-05-19 DIAGNOSIS — O219 Vomiting of pregnancy, unspecified: Secondary | ICD-10-CM

## 2020-05-19 DIAGNOSIS — Z3491 Encounter for supervision of normal pregnancy, unspecified, first trimester: Secondary | ICD-10-CM

## 2020-05-19 LAB — POCT URINE PREGNANCY: Preg Test, Ur: POSITIVE — AB

## 2020-05-19 MED ORDER — DOXYLAMINE-PYRIDOXINE 10-10 MG PO TBEC: DELAYED_RELEASE_TABLET | ORAL | 6 refills | Status: DC

## 2020-05-19 MED ORDER — VITAFOL GUMMIES 3.33-0.333-34.8 MG PO CHEW: 3.0000 | CHEWABLE_TABLET | Freq: Every day | ORAL | 12 refills | Status: DC

## 2020-05-19 MED ORDER — BLOOD PRESSURE KIT DEVI: 1.0000 | 0 refills | Status: DC

## 2020-05-19 NOTE — Progress Notes (Signed)
Taylor Sherman presents today for UPT. She has no unusual complaints.  LMP: 03/10/20    OBJECTIVE: Appears well, in no apparent distress.   Home UPT Result: Positive In-Office UPT result: Positive I have reviewed the patient's medical, obstetrical, social, and family histories, and medications.   ASSESSMENT: Positive pregnancy test  PLAN Prenatal care to be completed at: [facility] Labs to be completed at [facility] Provider Visit Blood pressure kit sent to the pharmacy Baby Scripts ordered. Medications, History, and Prenatal History reviewed. Rx for Diclegis sent to the pharmacy per protocol Prenatal Vitamins sent to pharmacy U/S performed today. Single ive IUP at [place] [place]

## 2020-05-19 NOTE — Progress Notes (Signed)
Patient was assessed and managed by nursing staff during this encounter. I have reviewed the chart and agree with the documentation and plan. I have also made any necessary editorial changes.  Jaynie Collins, MD 05/19/2020 10:22 AM

## 2020-05-26 ENCOUNTER — Ambulatory Visit (HOSPITAL_COMMUNITY)
Admission: EM | Admit: 2020-05-26 | Discharge: 2020-05-26 | Disposition: A | Discharge status: Home / Self Care | Payer: Medicaid Other | Attending: Physician Assistant | Admitting: Physician Assistant

## 2020-05-26 ENCOUNTER — Encounter (HOSPITAL_COMMUNITY): Payer: Self-pay | Admitting: Emergency Medicine

## 2020-05-26 ENCOUNTER — Other Ambulatory Visit: Payer: Self-pay

## 2020-05-26 DIAGNOSIS — O99331 Smoking (tobacco) complicating pregnancy, first trimester: Secondary | ICD-10-CM | POA: Diagnosis not present

## 2020-05-26 DIAGNOSIS — R110 Nausea: Secondary | ICD-10-CM | POA: Insufficient documentation

## 2020-05-26 DIAGNOSIS — Z3A01 Less than 8 weeks gestation of pregnancy: Secondary | ICD-10-CM | POA: Diagnosis not present

## 2020-05-26 DIAGNOSIS — O98511 Other viral diseases complicating pregnancy, first trimester: Secondary | ICD-10-CM | POA: Diagnosis not present

## 2020-05-26 DIAGNOSIS — F17210 Nicotine dependence, cigarettes, uncomplicated: Secondary | ICD-10-CM | POA: Insufficient documentation

## 2020-05-26 DIAGNOSIS — O10911 Unspecified pre-existing hypertension complicating pregnancy, first trimester: Secondary | ICD-10-CM | POA: Diagnosis not present

## 2020-05-26 DIAGNOSIS — J069 Acute upper respiratory infection, unspecified: Secondary | ICD-10-CM | POA: Diagnosis not present

## 2020-05-26 DIAGNOSIS — Z79899 Other long term (current) drug therapy: Secondary | ICD-10-CM | POA: Insufficient documentation

## 2020-05-26 DIAGNOSIS — K5900 Constipation, unspecified: Secondary | ICD-10-CM | POA: Diagnosis not present

## 2020-05-26 DIAGNOSIS — O26891 Other specified pregnancy related conditions, first trimester: Secondary | ICD-10-CM | POA: Insufficient documentation

## 2020-05-26 DIAGNOSIS — O99611 Diseases of the digestive system complicating pregnancy, first trimester: Secondary | ICD-10-CM | POA: Diagnosis not present

## 2020-05-26 DIAGNOSIS — Z20822 Contact with and (suspected) exposure to covid-19: Secondary | ICD-10-CM | POA: Insufficient documentation

## 2020-05-26 LAB — SARS CORONAVIRUS 2 (TAT 6-24 HRS): SARS Coronavirus 2: NEGATIVE

## 2020-05-26 MED ORDER — POLYETHYLENE GLYCOL 3350 17 GM/SCOOP PO POWD: 17.0000 g | Freq: Every day | ORAL | 0 refills | Status: DC

## 2020-05-26 NOTE — Discharge Instructions (Signed)
Take 1 scoop of miralax daily until regular easy bowel movements, drink plenty of fluids  Your OB has sent diclegis to your pharymacy already, take as prescribed  Monitor symptoms, if severe abdominal pain, vaginal discharge, shortness of breath or other concerning symptoms go to the MAU at [facility] as we discussed  Follow-up with your OB/GYN  If your Covid-19 test is positive, you will receive a phone call from [facility] regarding your results. Negative test results are not called. Both positive and negative results area always visible on MyChart. If you do not have a MyChart account, sign up instructions are in your discharge papers.   Persons who are directed to care for themselves at home may discontinue isolation under the following conditions:   At least 10 days have passed since symptom onset and  At least 24 hours have passed without running a fever (this means without the use of fever-reducing medications) and  Other symptoms have improved.  Persons infected with COVID-19 who never develop symptoms may discontinue isolation and other precautions 10 days after the date of their first positive COVID-19 test.

## 2020-05-26 NOTE — ED Triage Notes (Addendum)
Patient presents to [facility] for assessment because her partner, live-in, has COVID-like symptoms.  Patient is currently pregnant, due in April, and denies any symptoms but nausea and constipation, which she states has been going on for 1 week.  Patient is sniffling and clearing her throat at triage with this RN

## 2020-05-27 NOTE — ED Provider Notes (Signed)
[place]    CSN: [id number] Arrival date & time: 05/26/20  2778      History   Chief Complaint Chief Complaint  Patient presents with  . COVID Test  . Nausea    HPI Taylor Sherman is a 34 y.o. female.   Patient was recently found to be pregnant presents for Covid testing living partner developed Covid-like symptoms.  Patient has had slightly runny nose with occasional scratchy throat over the last several days.  She also endorses nausea and some constipation.  Nausea and constipation started her sounds same time she felt she was pregnant which is about 8 days ago.  She reports nausea hits around 5 PM most days.  She denies any vomiting.  She has not taken any medications for this.  She reports she feels constipated, however she has had bowel movements on most days.  Occasionally these are hard to have.  She denies any abdominal pain, vaginal bleeding.  She denies fever, chills, cough, headache.  Reports she feels well.  She reports she has had elevated blood pressure prior to this pregnancy and recently had her blood pressure medication started by her OB/GYN.     Past Medical History:  Diagnosis Date  . Anxiety   . Asthma   . Hypertension   . PTSD (post-traumatic stress disorder)     Patient Active Problem List   Diagnosis Date Noted  . Encounter for supervision of normal pregnancy, unspecified, first trimester 05/19/2020  . Chronic hypertension 02/24/2018  . Cervical intraepithelial neoplasia grade III with severe dysplasia 02/15/2018    Past Surgical History:  Procedure Laterality Date  . LEEP      OB History    Gravida  6   Para  3   Term  2   Preterm  1   AB  2   Living  3     SAB  2   TAB      Ectopic      Multiple      Live Births  3            Home Medications    Prior to Admission medications   Medication Sig Start Date End Date Taking? Authorizing Provider  amLODipine (NORVASC) 5 MG tablet Take 1 tablet (5 mg total)  by mouth daily. Patient not taking: Reported on 05/19/2020 03/09/20   Wieters, Madelynn Done C, PA-C  Blood Pressure Monitoring (BLOOD PRESSURE KIT) DEVI 1 kit by Does not apply route once a week. 05/19/20   Cephas Darby, MD  Doxylamine-Pyridoxine (DICLEGIS) 10-10 MG TBEC Take 2 tabs qhs then add 1 tab A.M and midday prn 05/19/20   Cephas Darby, MD  polyethylene glycol powder (GLYCOLAX/MIRALAX) 17 GM/SCOOP powder Take 17 g by mouth daily. 05/26/20   Inge Waldroup, Marguerita Beards, PA-C  Prenatal Vit-Fe Phos-FA-Omega (VITAFOL GUMMIES) 3.33-0.333-34.8 MG CHEW Chew 3 tablets by mouth daily. 05/19/20   Cephas Darby, MD    Family History Family History  Problem Relation Age of Onset  . Hypertension Mother   . Diabetes Mother   . Hypertension Maternal Grandmother   . Heart disease Maternal Grandmother     Social History Social History   Tobacco Use  . Smoking status: Current Every Day Smoker  . Smokeless tobacco: Never Used  . Tobacco comment: about 2 cigs a day  Vaping Use  . Vaping Use: Never used  Substance Use Topics  . Alcohol use: Not Currently    Comment: Last  drink was a month ago  . Drug use: No     Allergies   Patient has no known allergies.   Review of Systems Review of Systems   Physical Exam Triage Vital Signs ED Triage Vitals  Enc Vitals Group     BP 05/26/20 0956 (!) 148/102     Pulse Rate 05/26/20 0956 62     Resp 05/26/20 0956 18     Temp 05/26/20 0956 98.5 F (36.9 C)     Temp Source 05/26/20 0956 Oral     SpO2 05/26/20 0956 100 %     Weight --      Height --      Head Circumference --      Peak Flow --      Pain Score 05/26/20 0957 0     Pain Loc --      Pain Edu? --      Excl. in [place]? --    No data found.  Updated Vital Signs BP (!) 148/102 (BP Location: Right Arm) Comment: Patient states did not take her HTN meds this morning  Pulse 62   Temp 98.5 F (36.9 C) (Oral)   Resp 18   LMP 03/10/2020   SpO2 100%   Visual Acuity Right Eye Distance:     Left Eye Distance:   Bilateral Distance:    Right Eye Near:   Left Eye Near:    Bilateral Near:     Physical Exam Vitals and nursing note reviewed.  Constitutional:      General: She is not in acute distress.    Appearance: She is well-developed. She is not ill-appearing.  HENT:     Head: Normocephalic and atraumatic.     Right Ear: Tympanic membrane normal.     Left Ear: Tympanic membrane normal.     Nose: Congestion present.     Mouth/Throat:     Mouth: Mucous membranes are moist.     Pharynx: Oropharynx is clear.  Eyes:     Conjunctiva/sclera: Conjunctivae normal.  Cardiovascular:     Rate and Rhythm: Normal rate and regular rhythm.     Heart sounds: No murmur heard.   Pulmonary:     Effort: Pulmonary effort is normal. No respiratory distress.     Breath sounds: Normal breath sounds. No wheezing, rhonchi or rales.  Abdominal:     Palpations: Abdomen is soft.     Tenderness: There is no abdominal tenderness.  Musculoskeletal:     Cervical back: Neck supple.  Lymphadenopathy:     Cervical: No cervical adenopathy.  Skin:    General: Skin is warm and dry.  Neurological:     Mental Status: She is alert.      UC Treatments / Results  Labs (all labs ordered are listed, but only abnormal results are displayed) Labs Reviewed  SARS CORONAVIRUS 2 (TAT 6-24 HRS)    EKG   Radiology No results found.  Procedures Procedures (including critical care time)  Medications Ordered in UC Medications - No data to display  Initial Impression / Assessment and Plan / UC Course  I have reviewed the triage vital signs and the nursing notes.  Pertinent labs & imaging results that were available during my care of the patient were reviewed by me and considered in my medical decision making (see chart for details).     #Viral URI #Nausea #Constipation Patient is 34 year old pregnant female presenting with viral upper respiratory symptoms, pregnancy related nausea likely  and constipation.  Patient's OB/GYN had prescribed likely just, instructed patient to utilize this as prescribed.  Will use MiraLAX constipation.  Patient states she feels otherwise well.  Discussed return, follow-up and emergency for precautions.  Encouraged her to follow-up with her OB/GYN to discuss today's visit.  She verbalized agreement and understanding plan of care Final Clinical Impressions(s) / UC Diagnoses   Final diagnoses:  Viral URI  Nausea  Constipation, unspecified constipation type     Discharge Instructions     Take 1 scoop of miralax daily until regular easy bowel movements, drink plenty of fluids  Your OB has sent diclegis to your pharymacy already, take as prescribed  Monitor symptoms, if severe abdominal pain, vaginal discharge, shortness of breath or other concerning symptoms go to the MAU at [facility] as we discussed  Follow-up with your OB/GYN  If your Covid-19 test is positive, you will receive a phone call from [facility] regarding your results. Negative test results are not called. Both positive and negative results area always visible on MyChart. If you do not have a MyChart account, sign up instructions are in your discharge papers.   Persons who are directed to care for themselves at home may discontinue isolation under the following conditions:  . At least 10 days have passed since symptom onset and . At least 24 hours have passed without running a fever (this means without the use of fever-reducing medications) and . Other symptoms have improved.  Persons infected with COVID-19 who never develop symptoms may discontinue isolation and other precautions 10 days after the date of their first positive COVID-19 test.       ED Prescriptions    Medication Sig Dispense Auth. Provider   polyethylene glycol powder (GLYCOLAX/MIRALAX) 17 GM/SCOOP powder Take 17 g by mouth daily. 255 g Zayley Arras, Marguerita Beards, PA-C     PDMP not reviewed this encounter.   Purnell Shoemaker, PA-C 05/27/20 [phone]

## 2020-06-03 ENCOUNTER — Ambulatory Visit (INDEPENDENT_AMBULATORY_CARE_PROVIDER_SITE_OTHER): Payer: Medicaid Other | Admitting: Obstetrics and Gynecology

## 2020-06-03 ENCOUNTER — Encounter: Payer: Self-pay | Admitting: Obstetrics and Gynecology

## 2020-06-03 ENCOUNTER — Other Ambulatory Visit: Payer: Self-pay

## 2020-06-03 ENCOUNTER — Other Ambulatory Visit (HOSPITAL_COMMUNITY)
Admission: RE | Admit: 2020-06-03 | Discharge: 2020-06-03 | Disposition: A | Discharge status: Home / Self Care | Payer: Medicaid Other | Source: Ambulatory Visit | Attending: Obstetrics and Gynecology | Admitting: Obstetrics and Gynecology

## 2020-06-03 VITALS — BP 127/84 | HR 87 | Wt 127.5 lb

## 2020-06-03 DIAGNOSIS — Z9889 Other specified postprocedural states: Secondary | ICD-10-CM

## 2020-06-03 DIAGNOSIS — I10 Essential (primary) hypertension: Secondary | ICD-10-CM

## 2020-06-03 DIAGNOSIS — O3441 Maternal care for other abnormalities of cervix, first trimester: Secondary | ICD-10-CM | POA: Diagnosis not present

## 2020-06-03 DIAGNOSIS — Z3491 Encounter for supervision of normal pregnancy, unspecified, first trimester: Secondary | ICD-10-CM | POA: Insufficient documentation

## 2020-06-03 DIAGNOSIS — Z3A12 12 weeks gestation of pregnancy: Secondary | ICD-10-CM

## 2020-06-03 MED ORDER — NIFEDIPINE ER OSMOTIC RELEASE 30 MG PO TB24: 30.0000 mg | ORAL_TABLET | Freq: Every day | ORAL | 8 refills | Status: DC

## 2020-06-03 MED ORDER — ASPIRIN 81 MG PO CHEW: 81.0000 mg | CHEWABLE_TABLET | Freq: Every day | ORAL | 8 refills | Status: DC

## 2020-06-03 NOTE — Patient Instructions (Signed)
First Trimester of Pregnancy  The first trimester of pregnancy is from week 1 until the end of week 13 (months 1 through 3). During this time, your baby will begin to develop inside you. At 6-8 weeks, the eyes and face are formed, and the heartbeat can be seen on ultrasound. At the end of 12 weeks, all the baby's organs are formed. Prenatal care is all the medical care you receive before the birth of your baby. Make sure you get good prenatal care and follow all of your doctor's instructions. Follow these instructions at home: Medicines  Take over-the-counter and prescription medicines only as told by your doctor. Some medicines are safe and some medicines are not safe during pregnancy.  Take a prenatal vitamin that contains at least 600 micrograms (mcg) of folic acid.  If you have trouble pooping (constipation), take medicine that will make your stool soft (stool softener) if your doctor approves. Eating and drinking   Eat regular, healthy meals.  Your doctor will tell you the amount of weight gain that is right for you.  Avoid raw meat and uncooked cheese.  If you feel sick to your stomach (nauseous) or throw up (vomit): ? Eat 4 or 5 small meals a day instead of 3 large meals. ? Try eating a few soda crackers. ? Drink liquids between meals instead of during meals.  To prevent constipation: ? Eat foods that are high in fiber, like fresh fruits and vegetables, whole grains, and beans. ? Drink enough fluids to keep your pee (urine) clear or pale yellow. Activity  Exercise only as told by your doctor. Stop exercising if you have cramps or pain in your lower belly (abdomen) or low back.  Do not exercise if it is too hot, too humid, or if you are in a place of great height (high altitude).  Try to avoid standing for long periods of time. Move your legs often if you must stand in one place for a long time.  Avoid heavy lifting.  Wear low-heeled shoes. Sit and stand up  straight.  You can have sex unless your doctor tells you not to. Relieving pain and discomfort  Wear a good support bra if your breasts are sore.  Take warm water baths (sitz baths) to soothe pain or discomfort caused by hemorrhoids. Use hemorrhoid cream if your doctor says it is okay.  Rest with your legs raised if you have leg cramps or low back pain.  If you have puffy, bulging veins (varicose veins) in your legs: ? Wear support hose or compression stockings as told by your doctor. ? Raise (elevate) your feet for 15 minutes, 3-4 times a day. ? Limit salt in your food. Prenatal care  Schedule your prenatal visits by the twelfth week of pregnancy.  Write down your questions. Take them to your prenatal visits.  Keep all your prenatal visits as told by your doctor. This is important. Safety  Wear your seat belt at all times when driving.  Make a list of emergency phone numbers. The list should include numbers for family, friends, the hospital, and police and fire departments. General instructions  Ask your doctor for a referral to a local prenatal class. Begin classes no later than at the start of month 6 of your pregnancy.  Ask for help if you need counseling or if you need help with nutrition. Your doctor can give you advice or tell you where to go for help.  Do not use hot tubs, steam   rooms, or saunas.  Do not douche or use tampons or scented sanitary pads.  Do not cross your legs for long periods of time.  Avoid all herbs and alcohol. Avoid drugs that are not approved by your doctor.  Do not use any tobacco products, including cigarettes, chewing tobacco, and electronic cigarettes. If you need help quitting, ask your doctor. You may get counseling or other support to help you quit.  Avoid cat litter boxes and soil used by cats. These carry germs that can cause birth defects in the baby and can cause a loss of your baby (miscarriage) or stillbirth.  Visit your dentist.  At home, brush your teeth with a soft toothbrush. Be gentle when you floss. Contact a doctor if:  You are dizzy.  You have mild cramps or pressure in your lower belly.  You have a nagging pain in your belly area.  You continue to feel sick to your stomach, you throw up, or you have watery poop (diarrhea).  You have a bad smelling fluid coming from your vagina.  You have pain when you pee (urinate).  You have increased puffiness (swelling) in your face, hands, legs, or ankles. Get help right away if:  You have a fever.  You are leaking fluid from your vagina.  You have spotting or bleeding from your vagina.  You have very bad belly cramping or pain.  You gain or lose weight rapidly.  You throw up blood. It may look like coffee grounds.  You are around people who have [place] measles, fifth disease, or chickenpox.  You have a very bad headache.  You have shortness of breath.  You have any kind of trauma, such as from a fall or a car accident. Summary  The first trimester of pregnancy is from week 1 until the end of week 13 (months 1 through 3).  To take care of yourself and your unborn baby, you will need to eat healthy meals, take medicines only if your doctor tells you to do so, and do activities that are safe for you and your baby.  Keep all follow-up visits as told by your doctor. This is important as your doctor will have to ensure that your baby is healthy and growing well. This information is not intended to replace advice given to you by your health care provider. Make sure you discuss any questions you have with your health care provider. Document Revised: 12/13/2018 Document Reviewed: 08/30/2016 Elsevier Patient Education  2020 [employer] Hypertension During Pregnancy Hypertension is also called high blood pressure. High blood pressure means that the force of your blood moving in your body is too strong. It can cause problems for you and your baby. Different  types of high blood pressure can happen during pregnancy. The types are:  High blood pressure before you got pregnant. This is called chronic hypertension.  This can continue during your pregnancy. Your doctor will want to keep checking your blood pressure. You may need medicine to keep your blood pressure under control while you are pregnant. You will need follow-up visits after you have your baby.  High blood pressure that goes up during pregnancy when it was normal before. This is called gestational hypertension. It will usually get better after you have your baby, but your doctor will need to watch your blood pressure to make sure that it is getting better.  Very high blood pressure during pregnancy. This is called preeclampsia. Very high blood pressure is an emergency that  needs to be checked and treated right away.  You may develop very high blood pressure after giving birth. This is called postpartum preeclampsia. This usually occurs within 48 hours after childbirth but may occur up to 6 weeks after giving birth. This is rare. How does this affect me? If you have high blood pressure during pregnancy, you have a higher chance of developing high blood pressure:  As you get older.  If you get pregnant again. In some cases, high blood pressure during pregnancy can cause:  Stroke.  Heart attack.  Damage to the kidneys, lungs, or liver.  Preeclampsia.  Jerky movements you cannot control (convulsions or seizures).  Problems with the placenta. How does this affect my baby? Your baby may:  Be born early.  Not weigh as much as he or she should.  Not handle labor well, leading to a c-section birth. What are the risks?  Having high blood pressure during a past pregnancy.  Being overweight.  Being 15 years old or older.  Being pregnant for the first time.  Being pregnant with more than one baby.  Becoming pregnant using fertility methods, such as IVF.  Having other  problems, such as diabetes, or kidney disease.  Having family members who have high blood pressure. What can I do to lower my risk?   Keep a healthy weight.  Eat a healthy diet.  Follow what your doctor tells you about treating any medical problems that you had before becoming pregnant. It is very important to go to all of your doctor visits. Your doctor will check your blood pressure and make sure that your pregnancy is progressing as it should. Treatment should start early if a problem is found. How is this treated? Treatment for high blood pressure during pregnancy can differ depending on the type of high blood pressure you have and how serious it is.  You may need to take blood pressure medicine.  If you have been taking medicine for your blood pressure, you may need to change the medicine during pregnancy if it is not safe for your baby.  If your doctor thinks that you could get very high blood pressure, he or she may tell you to take a low-dose aspirin during your pregnancy.  If you have very high blood pressure, you may need to stay in the hospital so you and your baby can be watched closely. You may also need to take medicine to lower your blood pressure. This medicine may be given by mouth or through an IV tube.  In some cases, if your condition gets worse, you may need to have your baby early. Follow these instructions at home: Eating and drinking   Drink enough fluid to keep your pee (urine) pale yellow.  Avoid caffeine. Lifestyle  Do not use any products that contain nicotine or tobacco, such as cigarettes, e-cigarettes, and chewing tobacco. If you need help quitting, ask your doctor.  Do not use alcohol or drugs.  Avoid stress.  Rest and get plenty of sleep.  Regular exercise can help. Ask your doctor what kinds of exercise are best for you. General instructions  Take over-the-counter and prescription medicines only as told by your doctor.  Keep all prenatal  and follow-up visits as told by your doctor. This is important. Contact a doctor if:  You have symptoms that your doctor told you to watch for, such as: ? Headaches. ? Nausea. ? Vomiting. ? Belly (abdominal) pain. ? Dizziness. ? Light-headedness. Get help right  away if:  You have: ? Very bad belly pain that does not get better with treatment. ? A very bad headache that does not get better. ? Vomiting that does not get better. ? Sudden, fast weight gain. ? Sudden swelling in your hands, ankles, or face. ? Bleeding from your vagina. ? Blood in your pee. ? Blurry vision. ? Double vision. ? Shortness of breath. ? Chest pain. ? Weakness on one side of your body. ? Trouble talking.  Your baby is not moving as much as usual. Summary  High blood pressure is also called hypertension.  High blood pressure means that the force of your blood moving in your body is too strong.  High blood pressure can cause problems for you and your baby.  Keep all follow-up visits as told by your doctor. This is important. This information is not intended to replace advice given to you by your health care provider. Make sure you discuss any questions you have with your health care provider. Document Revised: 12/13/2018 Document Reviewed: 09/18/2018 Elsevier Patient Education  2020 [employer].

## 2020-06-03 NOTE — Progress Notes (Signed)
Pt is here for initial OB visit. LMP 03/10/20, EDD 12/15/20.  

## 2020-06-03 NOTE — Progress Notes (Signed)
INITIAL PRENATAL VISIT NOTE  Subjective:  Taylor Sherman is a 34 y.o. [id number] at 33w1dby sure LMP being seen today for her initial prenatal visit. This is a planned pregnancy.  She was using nexplanon for birth control previously. She has an obstetric history significant for hx of LEEP and CHTN. She has a medical history significant for CIN 3.  Patient reports no complaints.  Contractions: Not present. Vag. Bleeding: None.   . Denies leaking of fluid.    Past Medical History:  Diagnosis Date  . Anxiety   . Asthma   . Hypertension   . PTSD (post-traumatic stress disorder)     Past Surgical History:  Procedure Laterality Date  . LEEP      OB History  Gravida Para Term Preterm AB Living  '6 3 2 1 2 3  ' SAB TAB Ectopic Multiple Live Births  2       3    # Outcome Date GA Lbr Len/2nd Weight Sex Delivery Anes PTL Lv  6 Current           5 Term 10/05/10 458w0d F Vag-Spont   LIV  4 Preterm 01/11/08 [id number] Vag-Spont   LIV  3 Term 06/21/06    F Vag-Spont   LIV  2 SAB           1 SAB             Social History   Socioeconomic History  . Marital status: Married    Spouse name: Not on file  . Number of children: Not on file  . Years of education: Not on file  . Highest education level: Not on file  Occupational History  . Not on file  Tobacco Use  . Smoking status: Current Every Day Smoker  . Smokeless tobacco: Never Used  . Tobacco comment: about 2 cigs a day  Vaping Use  . Vaping Use: Never used  Substance and Sexual Activity  . Alcohol use: Not Currently    Comment: Last drink was a month ago  . Drug use: No  . Sexual activity: Yes    Partners: Male    Birth control/protection: None  Other Topics Concern  . Not on file  Social History Narrative  . Not on file   Social Determinants of Health   Financial Resource Strain:   . Difficulty of Paying Living Expenses: Not on file  Food Insecurity:   . Worried About [employer] fundraiser the Last Year: Not on  file  . Ran Out of Food in the Last Year: Not on file  Transportation Needs:   . Lack of Transportation (Medical): Not on file  . Lack of Transportation (Non-Medical): Not on file  Physical Activity:   . Days of Exercise per Week: Not on file  . Minutes of Exercise per Session: Not on file  Stress:   . Feeling of Stress : Not on file  Social Connections:   . Frequency of Communication with Friends and Family: Not on file  . Frequency of Social Gatherings with Friends and Family: Not on file  . Attends Religious Services: Not on file  . Active Member of Clubs or Organizations: Not on file  . Attends CluArchivistetings: Not on file  . Marital Status: Not on file    Family History  Problem Relation Age of Onset  . Hypertension Mother   . Diabetes Mother   . Hypertension Maternal  Grandmother   . Heart disease Maternal Grandmother      Current Outpatient Medications:  .  Blood Pressure Monitoring (BLOOD PRESSURE KIT) DEVI, 1 kit by Does not apply route once a week., Disp: 1 each, Rfl: 0 .  Doxylamine-Pyridoxine (DICLEGIS) 10-10 MG TBEC, Take 2 tabs qhs then add 1 tab A.M and midday prn, Disp: 90 tablet, Rfl: 6 .  polyethylene glycol powder (GLYCOLAX/MIRALAX) 17 GM/SCOOP powder, Take 17 g by mouth daily., Disp: 255 g, Rfl: 0 .  Prenatal Vit-Fe Phos-FA-Omega (VITAFOL GUMMIES) 3.33-0.333-34.8 MG CHEW, Chew 3 tablets by mouth daily., Disp: 90 tablet, Rfl: 12 .  aspirin 81 MG chewable tablet, Chew 1 tablet (81 mg total) by mouth daily., Disp: 30 tablet, Rfl: 8 .  NIFEdipine (PROCARDIA-XL/NIFEDICAL-XL) 30 MG 24 hr tablet, Take 1 tablet (30 mg total) by mouth daily., Disp: 30 tablet, Rfl: 8  No Known Allergies  Review of Systems: Negative except for what is mentioned in HPI.  Objective:   Vitals:   06/03/20 1409  BP: 127/84  Pulse: 87  Weight: 127 lb 8 oz (57.8 kg)    Fetal Status: Fetal Heart Rate (bpm): 152         Physical Exam: BP 127/84   Pulse 87   Wt 127 lb  8 oz (57.8 kg)   LMP 03/10/2020   BMI 23.32 kg/m  CONSTITUTIONAL: Well-developed, well-nourished female in no acute distress.  NEUROLOGIC: Alert and oriented to person, place, and time. Normal reflexes, muscle tone coordination. No cranial nerve deficit noted. PSYCHIATRIC: Normal mood and affect. Normal behavior. Normal judgment and thought content. SKIN: Skin is warm and dry. No rash noted. Not diaphoretic. No erythema. No pallor. HENT:  Normocephalic, atraumatic, External right and left ear normal. Oropharynx is clear and moist EYES: Conjunctivae and EOM are normal. Pupils are equal, round, and reactive to light. No scleral icterus.  NECK: Normal range of motion, supple, no masses CARDIOVASCULAR: Normal heart rate noted, regular rhythm RESPIRATORY: Effort and breath sounds normal, no problems with respiration noted ABDOMEN: Soft, nontender, nondistended, gravid. GU: normal appearing external female genitalia, multiparous cervix which is obviously s/p LEEP, scant white discharge in vagina, no lesions noted Bimanual: 12 weeks sized uterus, no adnexal tenderness or palpable lesions noted MUSCULOSKELETAL: Normal range of motion. EXT:  No edema and no tenderness. 2+ distal pulses.   Assessment and Plan:  Pregnancy: [id number] at 17w1dby sure LMP  1. Encounter for supervision of normal pregnancy in first trimester, unspecified gravidity BP check in 2 weeks - Culture, OB Urine - Genetic Screening - CBC/D/Plt+RPR+Rh+ABO+Rub Ab... - Enroll Patient in Babys[facility]s - Cytology - PAP( [facility]) - Comprehensive metabolic panel - Protein / creatinine ratio, urine - [place] OB DETAIL +14 WK; Future  2. Chronic hypertension Discontinued amlodipine in favor of procardia XL Started pt on daily baby ASA - Comprehensive metabolic panel - Protein / creatinine ratio, urine - aspirin 81 MG chewable tablet; Chew 1 tablet (81 mg total) by mouth daily.  Dispense: 30 tablet; Refill: 8 - NIFEdipine  (PROCARDIA-XL/NIFEDICAL-XL) 30 MG 24 hr tablet; Take 1 tablet (30 mg total) by mouth daily.  Dispense: 30 tablet; Refill: 8  3. Hx LEEP (loop electrosurgical excision procedure), cervix, pregnancy, first trimester Cervical length at next u/s  4. [redacted] weeks gestation of pregnancy    Preterm labor symptoms and general obstetric precautions including but not limited to vaginal bleeding, contractions, leaking of fluid and fetal movement were reviewed in detail with the patient.  Please refer to After Visit Summary for other counseling recommendations.  The nature of [place] with multiple MDs and other Advanced Practice Providers was explained to patient; also emphasized that residents, students are part of our team. Return in about 4 weeks (around 07/01/2020) for [facility], in person.  Griffin Basil 06/03/2020 2:42 PM

## 2020-06-04 ENCOUNTER — Other Ambulatory Visit (HOSPITAL_COMMUNITY)
Admission: RE | Admit: 2020-06-04 | Discharge: 2020-06-04 | Disposition: A | Discharge status: Home / Self Care | Payer: Medicaid Other | Source: Ambulatory Visit | Attending: Obstetrics and Gynecology | Admitting: Obstetrics and Gynecology

## 2020-06-04 ENCOUNTER — Other Ambulatory Visit: Payer: Self-pay

## 2020-06-04 DIAGNOSIS — N898 Other specified noninflammatory disorders of vagina: Secondary | ICD-10-CM | POA: Insufficient documentation

## 2020-06-04 LAB — COMPREHENSIVE METABOLIC PANEL
ALT: 13 IU/L (ref 0–32)
AST: 17 IU/L (ref 0–40)
Albumin/Globulin Ratio: 1.7 (ref 1.2–2.2)
Albumin: 4.3 g/dL (ref 3.8–4.8)
Alkaline Phosphatase: 41 IU/L — ABNORMAL LOW (ref 44–121)
BUN/Creatinine Ratio: 13 (ref 9–23)
BUN: 11 mg/dL (ref 6–20)
Bilirubin Total: 0.3 mg/dL (ref 0.0–1.2)
CO2: 22 mmol/L (ref 20–29)
Calcium: 9.8 mg/dL (ref 8.7–10.2)
Chloride: 102 mmol/L (ref 96–106)
Creatinine, Ser: 0.85 mg/dL (ref 0.57–1.00)
GFR calc Af Amer: 104 mL/min/{1.73_m2} (ref >59)
GFR calc non Af Amer: 90 mL/min/{1.73_m2} (ref >59)
Globulin, Total: 2.5 g/dL (ref 1.5–4.5)
Glucose: 76 mg/dL (ref 65–99)
Potassium: 4.8 mmol/L (ref 3.5–5.2)
Sodium: 136 mmol/L (ref 134–144)
Total Protein: 6.8 g/dL (ref 6.0–8.5)

## 2020-06-04 LAB — CBC/D/PLT+RPR+RH+ABO+RUB AB...
Antibody Screen: NEGATIVE
Basophils Absolute: 0 10*3/uL (ref 0.0–0.2)
Basos: 1 %
EOS (ABSOLUTE): 0.1 10*3/uL (ref 0.0–0.4)
Eos: 1 %
HCV Ab: <0.1 s/co ratio (ref 0.0–0.9)
HIV Screen 4th Generation wRfx: NONREACTIVE
Hematocrit: 38.5 % (ref 34.0–46.6)
Hemoglobin: 13.2 g/dL (ref 11.1–15.9)
Hepatitis B Surface Ag: NEGATIVE
Immature Grans (Abs): 0 10*3/uL (ref 0.0–0.1)
Immature Granulocytes: 0 %
Lymphocytes Absolute: 0.8 10*3/uL (ref 0.7–3.1)
Lymphs: 23 %
MCH: 30 pg (ref 26.6–33.0)
MCHC: 34.3 g/dL (ref 31.5–35.7)
MCV: 88 fL (ref 79–97)
Monocytes Absolute: 0.5 10*3/uL (ref 0.1–0.9)
Monocytes: 13 %
Neutrophils Absolute: 2.3 10*3/uL (ref 1.4–7.0)
Neutrophils: 62 %
Platelets: 231 10*3/uL (ref 150–450)
RBC: 4.4 x10E6/uL (ref 3.77–5.28)
RDW: 12.7 % (ref 11.7–15.4)
RPR Ser Ql: NONREACTIVE
Rh Factor: POSITIVE
Rubella Antibodies, IGG: 6.11 index (ref >0.99)
WBC: 3.7 10*3/uL (ref 3.4–10.8)

## 2020-06-04 LAB — HCV INTERPRETATION

## 2020-06-04 LAB — PROTEIN / CREATININE RATIO, URINE
Creatinine, Urine: 60.4 mg/dL
Protein, Ur: 19.2 mg/dL
Protein/Creat Ratio: 318 mg/g creat — ABNORMAL HIGH (ref 0–200)

## 2020-06-04 NOTE — Progress Notes (Addendum)
[facility] "Taylor Sherman" has orange specimen but no order

## 2020-06-05 LAB — CERVICOVAGINAL ANCILLARY ONLY
Bacterial Vaginitis (gardnerella): POSITIVE — AB
Candida Glabrata: NEGATIVE
Candida Vaginitis: NEGATIVE
Chlamydia: NEGATIVE
Comment: NEGATIVE
Comment: NEGATIVE
Comment: NEGATIVE
Comment: NEGATIVE
Comment: NEGATIVE
Comment: NORMAL
Neisseria Gonorrhea: NEGATIVE
Trichomonas: NEGATIVE

## 2020-06-05 LAB — CULTURE, OB URINE

## 2020-06-05 LAB — URINE CULTURE, OB REFLEX

## 2020-06-08 ENCOUNTER — Telehealth: Payer: Self-pay

## 2020-06-08 ENCOUNTER — Encounter: Payer: Self-pay | Admitting: Obstetrics and Gynecology

## 2020-06-08 LAB — CYTOLOGY - PAP
Comment: NEGATIVE
Diagnosis: UNDETERMINED — AB
High risk HPV: NEGATIVE

## 2020-06-08 MED ORDER — METRONIDAZOLE 500 MG PO TABS: 500.0000 mg | ORAL_TABLET | Freq: Two times a day (BID) | ORAL | 0 refills | Status: DC

## 2020-06-08 NOTE — Telephone Encounter (Signed)
Call patient to advised her test results. I have left a detail message for patient to call us back. Medication has been sent to her pharmacy on file.

## 2020-06-16 ENCOUNTER — Encounter: Payer: Self-pay | Admitting: Obstetrics and Gynecology

## 2020-07-01 ENCOUNTER — Encounter: Payer: Medicaid Other | Admitting: Obstetrics and Gynecology

## 2020-07-12 ENCOUNTER — Emergency Department (HOSPITAL_COMMUNITY): Payer: Medicaid Other

## 2020-07-12 ENCOUNTER — Other Ambulatory Visit: Payer: Self-pay

## 2020-07-12 ENCOUNTER — Encounter (HOSPITAL_COMMUNITY): Payer: Self-pay

## 2020-07-12 ENCOUNTER — Emergency Department (HOSPITAL_COMMUNITY)
Admission: EM | Admit: 2020-07-12 | Discharge: 2020-07-12 | Disposition: A | Discharge status: Home / Self Care | Payer: Medicaid Other | Attending: Emergency Medicine | Admitting: Emergency Medicine

## 2020-07-12 DIAGNOSIS — Z79899 Other long term (current) drug therapy: Secondary | ICD-10-CM | POA: Insufficient documentation

## 2020-07-12 DIAGNOSIS — Z7982 Long term (current) use of aspirin: Secondary | ICD-10-CM | POA: Insufficient documentation

## 2020-07-12 DIAGNOSIS — M25571 Pain in right ankle and joints of right foot: Secondary | ICD-10-CM | POA: Diagnosis present

## 2020-07-12 DIAGNOSIS — I10 Essential (primary) hypertension: Secondary | ICD-10-CM | POA: Insufficient documentation

## 2020-07-12 DIAGNOSIS — S93401A Sprain of unspecified ligament of right ankle, initial encounter: Secondary | ICD-10-CM | POA: Diagnosis not present

## 2020-07-12 DIAGNOSIS — F17210 Nicotine dependence, cigarettes, uncomplicated: Secondary | ICD-10-CM | POA: Diagnosis not present

## 2020-07-12 DIAGNOSIS — J45909 Unspecified asthma, uncomplicated: Secondary | ICD-10-CM | POA: Diagnosis not present

## 2020-07-12 LAB — CBC WITH DIFFERENTIAL/PLATELET
Abs Immature Granulocytes: 0.01 10*3/uL (ref 0.00–0.07)
Basophils Absolute: 0 10*3/uL (ref 0.0–0.1)
Basophils Relative: 1 %
Eosinophils Absolute: 0.1 10*3/uL (ref 0.0–0.5)
Eosinophils Relative: 2 %
HCT: 30.9 % — ABNORMAL LOW (ref 36.0–46.0)
Hemoglobin: 10.4 g/dL — ABNORMAL LOW (ref 12.0–15.0)
Immature Granulocytes: 0 %
Lymphocytes Relative: 30 %
Lymphs Abs: 1.3 10*3/uL (ref 0.7–4.0)
MCH: 30.1 pg (ref 26.0–34.0)
MCHC: 33.7 g/dL (ref 30.0–36.0)
MCV: 89.6 fL (ref 80.0–100.0)
Monocytes Absolute: 0.7 10*3/uL (ref 0.1–1.0)
Monocytes Relative: 16 %
Neutro Abs: 2.3 10*3/uL (ref 1.7–7.7)
Neutrophils Relative %: 51 %
Platelets: 236 10*3/uL (ref 150–400)
RBC: 3.45 MIL/uL — ABNORMAL LOW (ref 3.87–5.11)
RDW: 13.3 % (ref 11.5–15.5)
WBC: 4.4 10*3/uL (ref 4.0–10.5)
nRBC: 0 % (ref 0.0–0.2)

## 2020-07-12 LAB — COMPREHENSIVE METABOLIC PANEL
ALT: 19 U/L (ref 0–44)
AST: 24 U/L (ref 15–41)
Albumin: 3.8 g/dL (ref 3.5–5.0)
Alkaline Phosphatase: 35 U/L — ABNORMAL LOW (ref 38–126)
Anion gap: 11 (ref 5–15)
BUN: 10 mg/dL (ref 6–20)
CO2: 21 mmol/L — ABNORMAL LOW (ref 22–32)
Calcium: 8.9 mg/dL (ref 8.9–10.3)
Chloride: 107 mmol/L (ref 98–111)
Creatinine, Ser: 0.71 mg/dL (ref 0.44–1.00)
GFR, Estimated: >60 mL/min (ref >60)
Glucose, Bld: 82 mg/dL (ref 70–99)
Potassium: 4.1 mmol/L (ref 3.5–5.1)
Sodium: 139 mmol/L (ref 135–145)
Total Bilirubin: 0.6 mg/dL (ref 0.3–1.2)
Total Protein: 6.9 g/dL (ref 6.5–8.1)

## 2020-07-12 LAB — URINALYSIS, ROUTINE W REFLEX MICROSCOPIC
Bacteria, UA: NONE SEEN
Bilirubin Urine: NEGATIVE
Glucose, UA: NEGATIVE mg/dL
Ketones, ur: NEGATIVE mg/dL
Leukocytes,Ua: NEGATIVE
Nitrite: NEGATIVE
Protein, ur: 100 mg/dL — AB
Specific Gravity, Urine: 1.027 (ref 1.005–1.030)
pH: 6 (ref 5.0–8.0)

## 2020-07-12 MED ORDER — ONDANSETRON 4 MG PO TBDP
4.0000 mg | ORAL_TABLET | Freq: Once | ORAL | Status: AC
  Administered 2020-07-12: 4 mg via ORAL
  Filled 2020-07-12: qty 1

## 2020-07-12 MED ORDER — IOHEXOL 350 MG/ML SOLN
50.0000 mL | Freq: Once | INTRAVENOUS | Status: AC | PRN
  Administered 2020-07-12: 50 mL via INTRAVENOUS

## 2020-07-12 MED ORDER — ACETAMINOPHEN 325 MG PO TABS
650.0000 mg | ORAL_TABLET | Freq: Once | ORAL | Status: AC
  Administered 2020-07-12: 650 mg via ORAL
  Filled 2020-07-12: qty 2
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
## 2020-07-12 NOTE — Discharge Instructions (Signed)
Your work-up today was overall reassuring.  Your x-rays of your ankle did not show any fractures, however of your symptoms continue you may need to follow-up with orthopedics for further evaluation.  Please wear the cam walker boot, elevate your leg, apply ice as needed.  Please make sure to follow-up with Dr. Donavan Foil as discussed this week.  Return to the ER if you have any worsening abdominal pain, bleeding, gushes of fluid, or any other new or concerning symptoms.  You may to the ER at the [facility] for women over by [facility] for this.
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
## 2020-07-12 NOTE — ED Notes (Signed)
Ortho tech called for cam boot

## 2020-07-12 NOTE — ED Notes (Signed)
Patient returns from CT actively vomiting. PA notified and ordered zofran

## 2020-07-20 ENCOUNTER — Encounter: Payer: Medicaid Other | Admitting: Obstetrics and Gynecology

## 2020-07-21 ENCOUNTER — Ambulatory Visit: Payer: Medicaid Other

## 2020-07-21 ENCOUNTER — Ambulatory Visit: Payer: Medicaid Other | Attending: Obstetrics and Gynecology

## 2020-08-16 ENCOUNTER — Emergency Department (HOSPITAL_COMMUNITY)
Admission: EM | Admit: 2020-08-16 | Discharge: 2020-08-17 | Disposition: A | Discharge status: Home / Self Care | Payer: Medicaid Other | Attending: Emergency Medicine | Admitting: Emergency Medicine

## 2020-08-16 ENCOUNTER — Other Ambulatory Visit: Payer: Self-pay

## 2020-08-16 ENCOUNTER — Encounter (HOSPITAL_COMMUNITY): Payer: Self-pay | Admitting: *Deleted

## 2020-08-16 DIAGNOSIS — R1031 Right lower quadrant pain: Secondary | ICD-10-CM | POA: Insufficient documentation

## 2020-08-16 DIAGNOSIS — I10 Essential (primary) hypertension: Secondary | ICD-10-CM | POA: Insufficient documentation

## 2020-08-16 DIAGNOSIS — Z7982 Long term (current) use of aspirin: Secondary | ICD-10-CM | POA: Insufficient documentation

## 2020-08-16 DIAGNOSIS — O26899 Other specified pregnancy related conditions, unspecified trimester: Secondary | ICD-10-CM

## 2020-08-16 DIAGNOSIS — O9A212 Injury, poisoning and certain other consequences of external causes complicating pregnancy, second trimester: Secondary | ICD-10-CM | POA: Insufficient documentation

## 2020-08-16 DIAGNOSIS — F17200 Nicotine dependence, unspecified, uncomplicated: Secondary | ICD-10-CM | POA: Insufficient documentation

## 2020-08-16 DIAGNOSIS — J45909 Unspecified asthma, uncomplicated: Secondary | ICD-10-CM | POA: Diagnosis not present

## 2020-08-16 DIAGNOSIS — Z3A22 22 weeks gestation of pregnancy: Secondary | ICD-10-CM | POA: Insufficient documentation

## 2020-08-16 MED ORDER — ACETAMINOPHEN 325 MG PO TABS
650.0000 mg | ORAL_TABLET | Freq: Once | ORAL | Status: AC
  Administered 2020-08-16: 650 mg via ORAL
  Filled 2020-08-16: qty 2
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
## 2020-08-16 NOTE — ED Notes (Signed)
Called [facility] and they will come to assess pt

## 2020-08-17 LAB — URINALYSIS, ROUTINE W REFLEX MICROSCOPIC
Bacteria, UA: NONE SEEN
Bilirubin Urine: NEGATIVE
Glucose, UA: NEGATIVE mg/dL
Ketones, ur: NEGATIVE mg/dL
Leukocytes,Ua: NEGATIVE
Nitrite: NEGATIVE
Protein, ur: 100 mg/dL — AB
Specific Gravity, Urine: 1.027 (ref 1.005–1.030)
pH: 6 (ref 5.0–8.0)

## 2020-08-17 NOTE — Progress Notes (Signed)
Dr. Vergie Living made aware of 2x ctx and pt. Pain. Fetal heart tones 140-145bpm. Pt. States she's feeling a tiny bit better after using the restroom. Dr. Vergie Living advising patient okay to take off monitoring. EDP Dr. Bebe Shaggy made aware and will contact for any further testing/monitoring needs. Toco removed at this time. Pt. Educated about [facility] location if any further needs.
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
## 2020-09-05 NOTE — L&D Delivery Note (Signed)
OB/GYN Faculty Practice Delivery Note  Taylor Sherman is a 35 y.o. [id number] s/p vaginal delivery at [redacted]w[redacted]d. She was admitted for IOL secondary to placental abruption in setting of cHTN and cocaine use (+UDS on admission).  ROM: 1h 25m with clear fluid GBS Status: unknown; inadequate antibiotics prior to delivery Maximum Maternal Temperature: 98.19F  Labor Progress: Pt in latent labor on admission. Gross vaginal bleeding with abdominal pain consistent with placental abruption. Pt received epidural on arrival to L&D. Augmentation with pitocin and AROM. She progressed to complete cervical dilation and had an uncomplicated delivery as noted above.  Delivery Date/Time: 11/18/20 at 0411 Delivery: Called to room and patient was complete and pushing. Head delivered ROA. Nuchal cord present x1, reduced prior to delivery. Shoulder and body delivered in usual fashion. Infant with spontaneous cry, placed on mother's abdomen, dried and stimulated. Cord clamped x 2 after 1-minute delay, and cut by FOB under my direct supervision. Cord blood drawn. Placenta delivered spontaneously with gentle cord traction. Fundus firm with massage and Pitocin. Labia, perineum, vagina, and cervix were inspected, without evidence of lacerations.  Placenta: 3-vessel cord, intact, sent to pathology Complications: placental abruption as noted above Lacerations: none EBL: 200 ml Analgesia: epidural  Infant: viable female  APGARs 8 & 9  weight per medical record  Lynnda Shields, MD OB/GYN Fellow, Faculty Practice

## 2020-09-25 ENCOUNTER — Other Ambulatory Visit: Payer: Self-pay

## 2020-09-25 ENCOUNTER — Encounter (HOSPITAL_COMMUNITY): Payer: Self-pay | Admitting: Family Medicine

## 2020-09-25 ENCOUNTER — Inpatient Hospital Stay (HOSPITAL_COMMUNITY)
Admission: AD | Admit: 2020-09-25 | Discharge: 2020-09-25 | Disposition: A | Discharge status: Home / Self Care | Payer: Medicaid Other | Attending: Family Medicine | Admitting: Family Medicine

## 2020-09-25 DIAGNOSIS — O10013 Pre-existing essential hypertension complicating pregnancy, third trimester: Secondary | ICD-10-CM | POA: Diagnosis not present

## 2020-09-25 DIAGNOSIS — Z3A28 28 weeks gestation of pregnancy: Secondary | ICD-10-CM | POA: Diagnosis not present

## 2020-09-25 DIAGNOSIS — R109 Unspecified abdominal pain: Secondary | ICD-10-CM | POA: Diagnosis not present

## 2020-09-25 DIAGNOSIS — O26892 Other specified pregnancy related conditions, second trimester: Secondary | ICD-10-CM | POA: Diagnosis not present

## 2020-09-25 DIAGNOSIS — O09213 Supervision of pregnancy with history of pre-term labor, third trimester: Secondary | ICD-10-CM | POA: Insufficient documentation

## 2020-09-25 DIAGNOSIS — O10919 Unspecified pre-existing hypertension complicating pregnancy, unspecified trimester: Secondary | ICD-10-CM

## 2020-09-25 DIAGNOSIS — R102 Pelvic and perineal pain: Secondary | ICD-10-CM | POA: Insufficient documentation

## 2020-09-25 DIAGNOSIS — O26893 Other specified pregnancy related conditions, third trimester: Secondary | ICD-10-CM

## 2020-09-25 DIAGNOSIS — E860 Dehydration: Secondary | ICD-10-CM

## 2020-09-25 DIAGNOSIS — O99333 Smoking (tobacco) complicating pregnancy, third trimester: Secondary | ICD-10-CM | POA: Insufficient documentation

## 2020-09-25 DIAGNOSIS — R059 Cough, unspecified: Secondary | ICD-10-CM

## 2020-09-25 DIAGNOSIS — F17200 Nicotine dependence, unspecified, uncomplicated: Secondary | ICD-10-CM | POA: Insufficient documentation

## 2020-09-25 DIAGNOSIS — Z7982 Long term (current) use of aspirin: Secondary | ICD-10-CM | POA: Insufficient documentation

## 2020-09-25 DIAGNOSIS — O98513 Other viral diseases complicating pregnancy, third trimester: Secondary | ICD-10-CM | POA: Diagnosis not present

## 2020-09-25 DIAGNOSIS — U071 COVID-19: Secondary | ICD-10-CM | POA: Insufficient documentation

## 2020-09-25 DIAGNOSIS — B349 Viral infection, unspecified: Secondary | ICD-10-CM

## 2020-09-25 DIAGNOSIS — O10913 Unspecified pre-existing hypertension complicating pregnancy, third trimester: Secondary | ICD-10-CM

## 2020-09-25 LAB — PROTEIN / CREATININE RATIO, URINE
Creatinine, Urine: 334.08 mg/dL
Protein Creatinine Ratio: 0.48 mg/mg{Cre} — ABNORMAL HIGH (ref 0.00–0.15)
Total Protein, Urine: 159 mg/dL

## 2020-09-25 LAB — COMPREHENSIVE METABOLIC PANEL
ALT: 20 U/L (ref 0–44)
AST: 24 U/L (ref 15–41)
Albumin: 2.7 g/dL — ABNORMAL LOW (ref 3.5–5.0)
Alkaline Phosphatase: 41 U/L (ref 38–126)
Anion gap: 10 (ref 5–15)
BUN: <5 mg/dL — ABNORMAL LOW (ref 6–20)
CO2: 20 mmol/L — ABNORMAL LOW (ref 22–32)
Calcium: 8.1 mg/dL — ABNORMAL LOW (ref 8.9–10.3)
Chloride: 102 mmol/L (ref 98–111)
Creatinine, Ser: 0.61 mg/dL (ref 0.44–1.00)
GFR, Estimated: >60 mL/min (ref >60)
Glucose, Bld: 84 mg/dL (ref 70–99)
Potassium: 3 mmol/L — ABNORMAL LOW (ref 3.5–5.1)
Sodium: 132 mmol/L — ABNORMAL LOW (ref 135–145)
Total Bilirubin: 0.7 mg/dL (ref 0.3–1.2)
Total Protein: 5.4 g/dL — ABNORMAL LOW (ref 6.5–8.1)

## 2020-09-25 LAB — CBC
HCT: 25.6 % — ABNORMAL LOW (ref 36.0–46.0)
Hemoglobin: 8.8 g/dL — ABNORMAL LOW (ref 12.0–15.0)
MCH: 30.6 pg (ref 26.0–34.0)
MCHC: 34.4 g/dL (ref 30.0–36.0)
MCV: 88.9 fL (ref 80.0–100.0)
Platelets: 210 10*3/uL (ref 150–400)
RBC: 2.88 MIL/uL — ABNORMAL LOW (ref 3.87–5.11)
RDW: 13.2 % (ref 11.5–15.5)
WBC: 6.4 10*3/uL (ref 4.0–10.5)
nRBC: 0 % (ref 0.0–0.2)

## 2020-09-25 LAB — URINALYSIS, ROUTINE W REFLEX MICROSCOPIC
Bacteria, UA: NONE SEEN
Bilirubin Urine: NEGATIVE
Glucose, UA: NEGATIVE mg/dL
Hgb urine dipstick: NEGATIVE
Ketones, ur: 80 mg/dL — AB
Leukocytes,Ua: NEGATIVE
Nitrite: NEGATIVE
Protein, ur: >=300 mg/dL — AB
Specific Gravity, Urine: 1.026 (ref 1.005–1.030)
pH: 6 (ref 5.0–8.0)

## 2020-09-25 MED ORDER — ACETAMINOPHEN 500 MG PO TABS
1000.0000 mg | ORAL_TABLET | Freq: Once | ORAL | Status: AC
  Administered 2020-09-25: 1000 mg via ORAL
  Filled 2020-09-25: qty 2

## 2020-09-25 MED ORDER — CYCLOBENZAPRINE HCL 10 MG PO TABS: 10.0000 mg | ORAL_TABLET | Freq: Three times a day (TID) | ORAL | 0 refills | Status: DC | PRN

## 2020-09-25 MED ORDER — LACTATED RINGERS IV BOLUS
1000.0000 mL | Freq: Once | INTRAVENOUS | Status: AC
  Administered 2020-09-25: 1000 mL via INTRAVENOUS

## 2020-09-25 MED ORDER — CYCLOBENZAPRINE HCL 5 MG PO TABS
10.0000 mg | ORAL_TABLET | Freq: Once | ORAL | Status: AC
  Administered 2020-09-25: 10 mg via ORAL
  Filled 2020-09-25: qty 2

## 2020-09-25 NOTE — MAU Provider Note (Addendum)
Assumed care at 20:20  10:55 PM Patient is s/p 2 L fluids.  I reviewed the CMP/CBC/UA- CMP s/f mild hyponatremia, hypokalemia which are consistent with dehydration and viral sx.   BPs remain within her normal range in pregnancy.  Reports pain is completely resolved. Reviewed with patient that I am reassured by her fetal tracing which at the time of my review was:  EFM: 150/mod/+accels no decels Toco: quiet  Patient has a worsening cough and is sweating. Reports onset of sx in the last 24 hours.  Reviewed with patient that COVID results are pending.  Encouraged appropriate hydration at home. Reviewed to return for worsening SOB especially if COVID positive. Reviewed with patient that she should isolate until symptoms resolve and at least for 5 days if COVID positive. She voiced understanding. I will provide a letter for her employer.   Patient is ready for discharge and understands return precautions. She feels ready to go home.   Federico Flake, MD, MPH, ABFM, [facility] for [facility]

## 2020-09-25 NOTE — MAU Provider Note (Signed)
Chief Complaint:  Contractions   Event Date/Time   First Provider Initiated Contact with Patient 09/25/20 1827     HPI: Taylor Sherman is a 35 y.o. [id number] at 91w3dwho presents to maternity admissions reporting severe intermittent pelvic pain that radiates into her thighs. This came on suddenly this evening and she presented via EMS, very upset as she has a history of preterm delivery. Calmed once she found out her cervix was closed and she was not laboring. Also endorses two days of congestion and cough. Denies vaginal bleeding, leaking of fluid, decreased fetal movement, fever, falls, or other recent illness.   Pregnancy Course: She has been seen once by [facility] but has only been seen for her initial OB visit. She has chronic hypertension and takes daily procardia (which she took today).  Past Medical History:  Diagnosis Date  . Anxiety   . Asthma   . Hypertension   . PTSD (post-traumatic stress disorder)    OB History  Gravida Para Term Preterm AB Living  '6 3 2 1 2 3  ' SAB IAB Ectopic Multiple Live Births  2       3    # Outcome Date GA Lbr Len/2nd Weight Sex Delivery Anes PTL Lv  6 Current           5 Term 10/05/10 416w0d F Vag-Spont   LIV  4 Preterm 01/11/08 [id number] Vag-Spont   LIV  3 Term 06/21/06    F Vag-Spont   LIV  2 SAB           1 SAB            Past Surgical History:  Procedure Laterality Date  . LEEP     Family History  Problem Relation Age of Onset  . Hypertension Mother   . Diabetes Mother   . Hypertension Maternal Grandmother   . Heart disease Maternal Grandmother    Social History   Tobacco Use  . Smoking status: Current Every Day Smoker  . Smokeless tobacco: Never Used  . Tobacco comment: about 2 cigs a day  Vaping Use  . Vaping Use: Never used  Substance Use Topics  . Alcohol use: Not Currently    Comment: Last drink was a month ago  . Drug use: No   No Known Allergies Medications Prior to Admission  Medication Sig Dispense Refill Last  Dose  . acetaminophen (TYLENOL) 325 MG tablet Take 650 mg by mouth every 6 (six) hours as needed for mild pain, fever or headache.     . aMarland Kitchenpirin 81 MG chewable tablet Chew 1 tablet (81 mg total) by mouth daily. (Patient not taking: Reported on 08/16/2020) 30 tablet 8   . Blood Pressure Monitoring (BLOOD PRESSURE KIT) DEVI 1 kit by Does not apply route once a week. 1 each 0   . Doxylamine-Pyridoxine (DICLEGIS) 10-10 MG TBEC Take 2 tabs qhs then add 1 tab A.M and midday prn 90 tablet 6   . metroNIDAZOLE (FLAGYL) 500 MG tablet Take 1 tablet (500 mg total) by mouth 2 (two) times daily. (Patient not taking: No sig reported) 14 tablet 0   . NIFEdipine (PROCARDIA-XL/NIFEDICAL-XL) 30 MG 24 hr tablet Take 1 tablet (30 mg total) by mouth daily. 30 tablet 8   . polyethylene glycol powder (GLYCOLAX/MIRALAX) 17 GM/SCOOP powder Take 17 g by mouth daily. 255 g 0   . Prenatal Vit-Fe Phos-FA-Omega (VITAFOL GUMMIES) 3.33-0.333-34.8 MG CHEW Chew 3 tablets by mouth daily.  90 tablet 12     I have reviewed patient's Past Medical Hx, Surgical Hx, Family Hx, Social Hx, medications and allergies.   ROS:  Review of Systems  Constitutional: Negative for fever.  HENT: Negative for congestion and rhinorrhea.   Respiratory: Negative for cough.   Gastrointestinal: Negative for nausea and vomiting.  Genitourinary: Positive for pelvic pain.  Musculoskeletal: Positive for arthralgias and myalgias.  Neurological: Negative for dizziness, syncope, light-headedness and headaches.  All other systems reviewed and are negative.  Physical Exam   Patient Vitals for the past 24 hrs:  BP Temp Temp src Pulse Resp SpO2  09/25/20 2100 (!) 142/78 - - (!) 105 - 99 %  09/25/20 2045 (!) 142/78 - - (!) 102 - 99 %  09/25/20 2031 (!) 147/75 - - (!) 114 - -  09/25/20 2015 - - - - - 98 %  09/25/20 1900 (!) 153/84 - - (!) 115 - 100 %  09/25/20 1845 (!) 143/78 - - (!) 102 - 99 %  09/25/20 1830 (!) 142/82 - - (!) 101 - 99 %  09/25/20 1815  130/66 - - (!) 105 - 97 %  09/25/20 1801 (!) 123/59 - - (!) 105 - -  09/25/20 1743 (!) 149/87 - - (!) 101 - -  09/25/20 1742 - 99.6 F (37.6 C) Oral - 20 98 %   Constitutional: Well-developed, well-nourished female in acute distress.  Cardiovascular: normal rate & rhythm, no murmur Respiratory: normal effort, lung sounds clear throughout GI: Abd soft, non-tender, gravid appropriate for gestational age. Pos BS x 4 MS: Extremities nontender, no edema, normal ROM Neurologic: Alert and oriented x 4.  GU: no CVA tenderness Pelvic: NEFG, physiologic discharge, no blood  Dilation: Fingertip Effacement (%): Thick Cervical Position: Posterior Station: Ballotable Presentation: Undeterminable Exam by:: Gaylan Gerold, CNM  Fetal Tracing: reactive Baseline: 145 Variability: moderate for gestational age Accelerations: 10x10 Decelerations: none Toco: occasional and irregular   Labs: Results for orders placed or performed during the hospital encounter of 09/25/20 (from the past 24 hour(s))  Urinalysis, Routine w reflex microscopic     Status: Abnormal   Collection Time: 09/25/20  8:33 PM  Result Value Ref Range   Color, Urine AMBER (A) YELLOW   APPearance CLEAR CLEAR   Specific Gravity, Urine 1.026 1.005 - 1.030   pH 6.0 5.0 - 8.0   Glucose, UA NEGATIVE NEGATIVE mg/dL   Hgb urine dipstick NEGATIVE NEGATIVE   Bilirubin Urine NEGATIVE NEGATIVE   Ketones, ur 80 (A) NEGATIVE mg/dL   Protein, ur >=300 (A) NEGATIVE mg/dL   Nitrite NEGATIVE NEGATIVE   Leukocytes,Ua NEGATIVE NEGATIVE   RBC / HPF 11-20 0 - 5 RBC/hpf   WBC, UA 0-5 0 - 5 WBC/hpf   Bacteria, UA NONE SEEN NONE SEEN   Squamous Epithelial / LPF 0-5 0 - 5   Mucus PRESENT     Imaging:  No results found.  MAU Course: Orders Placed This Encounter  Procedures  . SARS CORONAVIRUS 2 (TAT 6-24 HRS) Nasopharyngeal Nasopharyngeal Swab  . Urinalysis, Routine w reflex microscopic  . CBC  . Comprehensive metabolic panel  . Protein  / creatinine ratio, urine  . Airborne and Contact precautions   Meds ordered this encounter  Medications  . lactated ringers bolus 1,000 mL  . acetaminophen (TYLENOL) tablet 1,000 mg  . cyclobenzaprine (FLEXERIL) tablet 10 mg  . lactated ringers bolus 1,000 mL   MDM: Covid swab obtained Pt given LR bolus, 1035m of Tylenol and  flexeril - relieved pain from 10/10 to 5/10  UA showed severe dehydration and proteinuria - ordered another bolus and preeclampsia labs  Care turned over to Lauretta Chester, MD at 2020  Assessment: No diagnosis found.  Plan: Discharge home in stable condition.

## 2020-09-25 NOTE — MAU Note (Signed)
Pt reports ctx's that started at 1300 and reports that the ctx's got worse so she called EMS.   Denies vaginal bleeding or LOF

## 2020-09-25 NOTE — Discharge Instructions (Signed)

## 2020-09-26 LAB — SARS CORONAVIRUS 2 (TAT 6-24 HRS): SARS Coronavirus 2: POSITIVE — AB

## 2020-10-05 ENCOUNTER — Other Ambulatory Visit: Payer: Self-pay

## 2020-10-05 ENCOUNTER — Ambulatory Visit (INDEPENDENT_AMBULATORY_CARE_PROVIDER_SITE_OTHER): Payer: Medicaid Other | Admitting: Obstetrics and Gynecology

## 2020-10-05 ENCOUNTER — Other Ambulatory Visit: Payer: Medicaid Other

## 2020-10-05 VITALS — BP 145/88 | HR 85 | Wt 143.0 lb

## 2020-10-05 DIAGNOSIS — Z3A29 29 weeks gestation of pregnancy: Secondary | ICD-10-CM | POA: Diagnosis not present

## 2020-10-05 DIAGNOSIS — Z3491 Encounter for supervision of normal pregnancy, unspecified, first trimester: Secondary | ICD-10-CM

## 2020-10-05 DIAGNOSIS — O10919 Unspecified pre-existing hypertension complicating pregnancy, unspecified trimester: Secondary | ICD-10-CM

## 2020-10-05 DIAGNOSIS — Z9889 Other specified postprocedural states: Secondary | ICD-10-CM

## 2020-10-05 DIAGNOSIS — O3441 Maternal care for other abnormalities of cervix, first trimester: Secondary | ICD-10-CM | POA: Diagnosis not present

## 2020-10-05 NOTE — Patient Instructions (Signed)
Surgery to Prevent Pregnancy Sterilization is surgery to prevent pregnancy. Sterilization is permanent. It should only be done if you are sure that you do not want to have children. For females, the fallopian tubes are either blocked or closed off. When the fallopian tubes are closed, the eggs that the ovaries release cannot enter the uterus, sperm cannot reach the eggs, and pregnancy is prevented. For males, the vas deferens is cut and then tied or burned (cauterized). The vas deferens is a tube that carries sperm from the testicles. This procedure prevents pregnancy by blocking sperm from going through the vas deferens and penis during ejaculation. Types of sterilization For females, the surgeries include:  Laparoscopic tubal ligation. In this surgery, the fallopian tubes are tied off, sealed with heat, or blocked with a clip, ring, or clamp. A small portion of each fallopian tube may also be removed. This surgery is done through several small cuts (incisions) with special instruments that are inserted into the abdomen.  Postpartum tubal ligation. This is also called a mini-laparotomy. This surgery is done right after childbirth or 1 or 2 days after childbirth. In this surgery, the fallopian tubes are tied off, sealed with heat, or blocked with a clip, ring, or clamp. A small portion of each fallopian tube may also be removed. The surgery is done through a single incision in the abdomen.  Tubal ligation during a C-section. In this surgery, the fallopian tubes are tied off, sealed with heat, or blocked with a clip, ring, or clamp. A small portion of each fallopian tube may also be removed. The surgery is done at the same time as a C-section delivery. For males, the surgeries include:  Incision vasectomy. In this surgery, one or two small incisions are made in the scrotum. The vas deferens will be pulled out of the scrotum and cut. The vas deferens will be tied off or sealed with heat and placed back  into your scrotum. The incision will be closed with absorbable stitches (sutures).  No scalpel vasectomy. In this surgery, a punctured opening is made in the scrotum. The vas deferens will be pulled out of the scrotum and cut. The vas deferens will be tied off or sealed with heat and placed back into your scrotum. The opening is small and will not require sutures.      What are the benefits of sterilization?  It is usually effective for a lifetime.  The procedures are generally safe.  For females, sterilization does not affect the hormones, like other types of birth control. Because of this, menstrual periods will not be affected.  For both males and females, sexual desire and sexual performance will not be affected. What are the disadvantages of sterilization? Risks from the surgery Generally, sterilization is safe. Complications are rare. However, there are some risks. They include:  Bleeding.  Infection.  Reaction to medicine used during the procedure.  Injury to surrounding organs. Risks after sterilization After a successful surgery, you may have other problems. Female sterilization risks may include:  Failure of the procedure. Sterilization is nearly 100% effective, but it can fail. In rare cases, the fallopian tubes can grow back together over time. If this happens, a female will be able to get pregnant again.  A higher risk of having an ectopic pregnancy. An ectopic pregnancy is a pregnancy that grows outside of the uterus. This kind of pregnancy can lead to serious bleeding if it is not treated. Female sterilization risks may include:  Bleeding and   swelling of the scrotum.  Failure of the procedure. There is a very small chance that the tied or cauterized ends of the vas deferens may reconnect (recanalization). If this happens, a female could still make a female pregnant. Other risks may include:  A risk that you may change your mind and decide you want have children.  Sterilization may be reversed, but a reversal is not always successful.  Lack of protection against sexually transmitted infections (STIs). What happens during the procedure? The steps of the procedure depend on the type of sterilization you are having. The procedure may vary among health care providers and hospitals. Questions to ask your health care provider  How effective are sterilization procedures?  What type of procedure is right for me?  Is it possible to reverse the procedure if I change my mind?  What can I expect after the procedure? Where to find more information American College of Obstetricians and Gynecologists: www.acog.org/ U.S. Department of Health and Human Services: www.womenshealth.gov/ [employer]: www.urologyhealth.org Summary  Sterilization is surgery to prevent pregnancy.  There are different types of sterilization surgeries.  Sterilization may be reversed, but a reversal is not always successful.  Sterilization does not protect against STIs. This information is not intended to replace advice given to you by your health care provider. Make sure you discuss any questions you have with your health care provider. Document Revised: 05/23/2020 Document Reviewed: 05/23/2020 Elsevier Patient Education  2021 Elsevier Inc. Postpartum Tubal Ligation Postpartum tubal ligation (PPTL) is a procedure to close the fallopian tubes. This is done to prevent pregnancy. When the fallopian tubes are closed, the eggs that the ovaries release cannot enter the uterus, and sperm cannot reach the eggs. PPTL is done right after childbirth or 1-2 days after childbirth, before the uterus returns to its normal position. If you have a cesarean section, it can be performed at the same time as the procedure. Having this done after childbirth does not make your stay in the hospital longer. PPTL is sometimes called "getting your tubes tied." You should not have this procedure if  you want to get pregnant again or if you are unsure about having more children. Tell a health care provider about:  Any allergies you have.  All medicines you are taking, including vitamins, herbs, eye drops, creams, and over-the-counter medicines.  Any problems you or family members have had with anesthetic medicines.  Any blood disorders you have.  Any surgeries you have had.  Any medical conditions you have or have had.  Any past pregnancies. What are the risks? Generally, this is a safe procedure. However, problems may occur, including:  Infection.  Bleeding.  Damage to other organs in the abdomen.  Side effects from anesthetic medicines.  Failure of the procedure. If this happens, you could get pregnant.  Ectopic pregnancy. This is a pregnancy in which the egg attaches outside the uterus. What happens before the procedure? Ask your health care provider about:  How much pain you can expect to have.  What medicines you will be given for pain, especially if you are breastfeeding. What happens during the procedure? If you had a vaginal delivery:  An IV will be inserted into one of your veins.  You will be given one or more of the following: ? A medicine to help you relax (sedative). ? A medicine to numb the area (local anesthetic). ? A medicine to make you fall asleep (general anesthetic). ? A medicine that is injected into   an area of your body to numb everything below the injection site (regional anesthetic).  If you have been given a general anesthetic, a tube will be put down your throat to help you breathe.  Your bladder may be emptied with a small tube (catheter).  An incision will be made just below your belly button.  Your fallopian tubes will be located and brought up through the incision.  Your fallopian tubes will be tied off, burned (cauterized), or blocked with a clip, ring, or clamp. A small part in the center of each fallopian tube may be  removed.  The incision will be closed with stitches (sutures).  A bandage (dressing) will be placed over the incision. If you had a cesarean delivery:  Tubal ligation will be done through the incision that was used for the cesarean delivery of your baby.  The incision will be closed with sutures.  A dressing will be placed over the incision. The procedure may vary among health care providers and hospitals.   What happens after the procedure?  Your blood pressure, heart rate, breathing rate, and blood oxygen level will be monitored until you leave the hospital.  You will be given pain medicine as needed.  You will be encouraged to get up early and walk to prevent blood clots.  If you were given a sedative during the procedure, it can affect you for several hours. Do not drive or operate machinery until your health care provider says that it is safe. Summary  Postpartum tubal ligation is a procedure that closes the fallopian tubes to prevent pregnancy.  This procedure is done while you are still in the hospital after childbirth. If you have a cesarean section, it can be performed at the same time.  Having this done after childbirth does not make your stay in the hospital longer.  Postpartum tubal ligation is considered permanent. You should not have this procedure if you want to get pregnant again or if you are unsure about having more children.  Talk to your health care provider to see if this procedure is right for you. This information is not intended to replace advice given to you by your health care provider. Make sure you discuss any questions you have with your health care provider. Document Revised: 05/08/2020 Document Reviewed: 05/08/2020 Elsevier Patient Education  2021 [employer].

## 2020-10-05 NOTE — Progress Notes (Signed)
Patient desires permanent sterilization.  Other reversible forms of contraception (over the counter/barrier methods; hormonal contraceptives including pill, patch, ring, Depo-Provera injection, Nexplanon implant; hormonal IUDs Skyla and Mirena; nonhormonal copper IUD Paragard) were discussed with patient; she declined all these modalities. Also discussed the option of vasectomy for her female partner; she also declined this option. She was given the choice between laparoscopic bilateral tubal sterilization using Filshie clips or laparoscopic bilateral salpingectomy. For the Filshie clip sterilization, she was told she will have one incision in her umbilicus.  Failure risk of 1-2 % with increased risk of ectopic gestation if pregnancy occurs was also discussed with patient. For the bilateral salpingectomy, she was told that both tubes will be resected via three small incisions; the failure risk of less than 1%.  Any future pregnancies will have to be attempted via IVF or other fertility procedures.  Reiterated permanence and irreversibility of both procedures; in the case of Filshie clip application, attempts to reverse tubal sterilization are often not successful.  Also emphasized risk of regret which is noted more in patients less than the age of 81.  All questions were answered. She desires laparoscopic bilateral salpingectomy.  Other risks of the procedure were discussed with patient including but not limited to: bleeding, infection, injury to surrounding organs and need for additional procedures.  Also discussed possibility of post-tubal pain syndrome. Patient verbalized understanding of these risks and wants to proceed with this procedure.   Printed patient education handouts about the procedure were given to the patient to review at home. Medicaid papers were signed today.   Mariel Aloe, MD, FACOG Obstetrician & Gynecologist, [facility] for [facility], [facility]

## 2020-10-05 NOTE — Progress Notes (Signed)
   PRENATAL VISIT NOTE  Subjective:  Coral A Genther is a 35 y.o. U3Y3338 at [redacted]w[redacted]d being seen today for ongoing prenatal care.  She is currently monitored for the following issues for this high-risk pregnancy and has Cervical intraepithelial neoplasia grade III with severe dysplasia; Chronic pre-existing hypertension during pregnancy; Encounter for supervision of normal pregnancy, unspecified, first trimester; Hx LEEP (loop electrosurgical excision procedure), cervix, pregnancy, first trimester; [redacted] weeks gestation of pregnancy; and [redacted] weeks gestation of pregnancy on their problem list.  Patient doing well with no acute concerns today. She reports no complaints.  Contractions: Not present. Vag. Bleeding: None.  Movement: Present. Denies leaking of fluid.   BP elevated, but pt does note taking her procardia today  The following portions of the patient's history were reviewed and updated as appropriate: allergies, current medications, past family history, past medical history, past social history, past surgical history and problem list. Problem list updated.  Objective:   Vitals:   10/05/20 0955  BP: (!) 145/88  Pulse: 85  Weight: 143 lb (64.9 kg)    Fetal Status: Fetal Heart Rate (bpm): 140 Fundal Height: 30 cm Movement: Present     General:  Alert, oriented and cooperative. Patient is in no acute distress.  Skin: Skin is warm and dry. No rash noted.   Cardiovascular: Normal heart rate noted  Respiratory: Normal respiratory effort, no problems with respiration noted  Abdomen: Soft, gravid, appropriate for gestational age.  Pain/Pressure: Present     Pelvic: Cervical exam deferred        Extremities: Normal range of motion.     Mental Status:  Normal mood and affect. Normal behavior. Normal judgment and thought content.   Assessment and Plan:  Pregnancy: [id number] at [redacted]w[redacted]d  1. Encounter for supervision of normal pregnancy in third trimester, unspecified gravidity 2 hour GTT and labs  drawn today  2. Chronic pre-existing hypertension during pregnancy Get anatomy and growth scan/ start BPP at 32 weeks If BP elevated at next visit, may need to increase dose of procardia - Glucose Tolerance, 2 Hours w/1 Hour - CBC - RPR - HIV Antibody (routine testing w rflx) - [place] MFM FETAL BPP WO NON STRESS; Future  3. [redacted] weeks gestation of pregnancy   4. Hx LEEP (loop electrosurgical excision procedure), cervix, pregnancy, first trimester   Preterm labor symptoms and general obstetric precautions including but not limited to vaginal bleeding, contractions, leaking of fluid and fetal movement were reviewed in detail with the patient.  Please refer to After Visit Summary for other counseling recommendations.   Return in about 2 weeks (around 10/19/2020) for [facility], in person.   Mariel Aloe, MD

## 2020-10-05 NOTE — Progress Notes (Signed)
Pt interested in BTL, please discuss - will sign consent today. Pt states she is seeing floater x 1 week, denies HA's.

## 2020-10-06 LAB — CBC
Hematocrit: 32.9 % — ABNORMAL LOW (ref 34.0–46.6)
Hemoglobin: 11.1 g/dL (ref 11.1–15.9)
MCH: 29.5 pg (ref 26.6–33.0)
MCHC: 33.7 g/dL (ref 31.5–35.7)
MCV: 88 fL (ref 79–97)
Platelets: 342 10*3/uL (ref 150–450)
RBC: 3.76 x10E6/uL — ABNORMAL LOW (ref 3.77–5.28)
RDW: 12 % (ref 11.7–15.4)
WBC: 6 10*3/uL (ref 3.4–10.8)

## 2020-10-06 LAB — GLUCOSE TOLERANCE, 2 HOURS W/ 1HR
Glucose, 1 hour: 146 mg/dL (ref 65–179)
Glucose, 2 hour: 92 mg/dL (ref 65–152)
Glucose, Fasting: 72 mg/dL (ref 65–91)

## 2020-10-06 LAB — HIV ANTIBODY (ROUTINE TESTING W REFLEX): HIV Screen 4th Generation wRfx: NONREACTIVE

## 2020-10-06 LAB — RPR: RPR Ser Ql: NONREACTIVE

## 2020-10-19 ENCOUNTER — Ambulatory Visit: Payer: Medicaid Other

## 2020-10-19 ENCOUNTER — Encounter: Payer: Medicaid Other | Admitting: Obstetrics and Gynecology

## 2020-10-19 ENCOUNTER — Other Ambulatory Visit: Payer: Medicaid Other

## 2020-10-21 ENCOUNTER — Telehealth: Payer: Self-pay

## 2020-10-21 NOTE — Telephone Encounter (Signed)
Taylor Sherman was called to schedule her an appointment.  Was unable to speak with the patient so a voicemail was left asking the patient to contact the office.

## 2020-10-28 ENCOUNTER — Ambulatory Visit: Payer: Medicaid Other | Admitting: *Deleted

## 2020-10-28 ENCOUNTER — Other Ambulatory Visit: Payer: Self-pay

## 2020-10-28 ENCOUNTER — Ambulatory Visit: Payer: Medicaid Other | Attending: Obstetrics and Gynecology

## 2020-10-28 ENCOUNTER — Encounter: Payer: Self-pay | Admitting: *Deleted

## 2020-10-28 VITALS — BP 146/93 | HR 98

## 2020-10-28 DIAGNOSIS — O10919 Unspecified pre-existing hypertension complicating pregnancy, unspecified trimester: Secondary | ICD-10-CM

## 2020-10-28 DIAGNOSIS — Z3491 Encounter for supervision of normal pregnancy, unspecified, first trimester: Secondary | ICD-10-CM | POA: Diagnosis not present

## 2020-10-28 DIAGNOSIS — I10 Essential (primary) hypertension: Secondary | ICD-10-CM | POA: Insufficient documentation

## 2020-10-29 ENCOUNTER — Other Ambulatory Visit: Payer: Self-pay | Admitting: *Deleted

## 2020-11-05 ENCOUNTER — Ambulatory Visit: Payer: Medicaid Other | Attending: Obstetrics and Gynecology

## 2020-11-05 ENCOUNTER — Ambulatory Visit: Payer: Medicaid Other

## 2020-11-11 ENCOUNTER — Ambulatory Visit: Payer: Medicaid Other

## 2020-11-11 ENCOUNTER — Ambulatory Visit: Payer: Medicaid Other | Attending: Obstetrics and Gynecology

## 2020-11-17 ENCOUNTER — Inpatient Hospital Stay (HOSPITAL_COMMUNITY): Payer: Medicaid Other | Admitting: Anesthesiology

## 2020-11-17 ENCOUNTER — Encounter (HOSPITAL_COMMUNITY): Payer: Self-pay | Admitting: Obstetrics and Gynecology

## 2020-11-17 ENCOUNTER — Inpatient Hospital Stay (HOSPITAL_COMMUNITY)
Admission: AD | Admit: 2020-11-17 | Discharge: 2020-11-21 | DRG: 797 | Disposition: A | Discharge status: Home / Self Care | Payer: Medicaid Other | Attending: Obstetrics and Gynecology | Admitting: Obstetrics and Gynecology

## 2020-11-17 DIAGNOSIS — Z3A36 36 weeks gestation of pregnancy: Secondary | ICD-10-CM | POA: Diagnosis not present

## 2020-11-17 DIAGNOSIS — Z23 Encounter for immunization: Secondary | ICD-10-CM

## 2020-11-17 DIAGNOSIS — O1093 Unspecified pre-existing hypertension complicating the puerperium: Secondary | ICD-10-CM | POA: Diagnosis not present

## 2020-11-17 DIAGNOSIS — O99324 Drug use complicating childbirth: Secondary | ICD-10-CM | POA: Diagnosis present

## 2020-11-17 DIAGNOSIS — F17200 Nicotine dependence, unspecified, uncomplicated: Secondary | ICD-10-CM | POA: Diagnosis present

## 2020-11-17 DIAGNOSIS — O99320 Drug use complicating pregnancy, unspecified trimester: Secondary | ICD-10-CM

## 2020-11-17 DIAGNOSIS — Z20822 Contact with and (suspected) exposure to covid-19: Secondary | ICD-10-CM | POA: Diagnosis present

## 2020-11-17 DIAGNOSIS — O1410 Severe pre-eclampsia, unspecified trimester: Secondary | ICD-10-CM

## 2020-11-17 DIAGNOSIS — Z302 Encounter for sterilization: Secondary | ICD-10-CM | POA: Diagnosis not present

## 2020-11-17 DIAGNOSIS — O114 Pre-existing hypertension with pre-eclampsia, complicating childbirth: Secondary | ICD-10-CM | POA: Diagnosis present

## 2020-11-17 DIAGNOSIS — I10 Essential (primary) hypertension: Secondary | ICD-10-CM

## 2020-11-17 DIAGNOSIS — O3441 Maternal care for other abnormalities of cervix, first trimester: Secondary | ICD-10-CM

## 2020-11-17 DIAGNOSIS — O1002 Pre-existing essential hypertension complicating childbirth: Secondary | ICD-10-CM | POA: Diagnosis present

## 2020-11-17 DIAGNOSIS — O4693 Antepartum hemorrhage, unspecified, third trimester: Secondary | ICD-10-CM | POA: Diagnosis present

## 2020-11-17 DIAGNOSIS — Z9889 Other specified postprocedural states: Secondary | ICD-10-CM

## 2020-11-17 DIAGNOSIS — F1290 Cannabis use, unspecified, uncomplicated: Secondary | ICD-10-CM

## 2020-11-17 DIAGNOSIS — F1490 Cocaine use, unspecified, uncomplicated: Secondary | ICD-10-CM | POA: Diagnosis present

## 2020-11-17 DIAGNOSIS — O4593 Premature separation of placenta, unspecified, third trimester: Secondary | ICD-10-CM | POA: Diagnosis present

## 2020-11-17 DIAGNOSIS — O365990 Maternal care for other known or suspected poor fetal growth, unspecified trimester, not applicable or unspecified: Secondary | ICD-10-CM

## 2020-11-17 DIAGNOSIS — O99334 Smoking (tobacco) complicating childbirth: Secondary | ICD-10-CM | POA: Diagnosis present

## 2020-11-17 DIAGNOSIS — O10919 Unspecified pre-existing hypertension complicating pregnancy, unspecified trimester: Secondary | ICD-10-CM | POA: Diagnosis present

## 2020-11-17 DIAGNOSIS — O1092 Unspecified pre-existing hypertension complicating childbirth: Secondary | ICD-10-CM | POA: Diagnosis not present

## 2020-11-17 DIAGNOSIS — O99330 Smoking (tobacco) complicating pregnancy, unspecified trimester: Secondary | ICD-10-CM

## 2020-11-17 DIAGNOSIS — O0990 Supervision of high risk pregnancy, unspecified, unspecified trimester: Secondary | ICD-10-CM

## 2020-11-17 DIAGNOSIS — O365930 Maternal care for other known or suspected poor fetal growth, third trimester, not applicable or unspecified: Secondary | ICD-10-CM | POA: Diagnosis present

## 2020-11-17 DIAGNOSIS — O115 Pre-existing hypertension with pre-eclampsia, complicating the puerperium: Secondary | ICD-10-CM | POA: Diagnosis not present

## 2020-11-17 DIAGNOSIS — F1410 Cocaine abuse, uncomplicated: Secondary | ICD-10-CM | POA: Diagnosis not present

## 2020-11-17 LAB — COMPREHENSIVE METABOLIC PANEL
ALT: 18 U/L (ref 0–44)
AST: 21 U/L (ref 15–41)
Albumin: 2.8 g/dL — ABNORMAL LOW (ref 3.5–5.0)
Alkaline Phosphatase: 109 U/L (ref 38–126)
Anion gap: 8 (ref 5–15)
BUN: <5 mg/dL — ABNORMAL LOW (ref 6–20)
CO2: 22 mmol/L (ref 22–32)
Calcium: 8.6 mg/dL — ABNORMAL LOW (ref 8.9–10.3)
Chloride: 104 mmol/L (ref 98–111)
Creatinine, Ser: 0.68 mg/dL (ref 0.44–1.00)
GFR, Estimated: >60 mL/min (ref >60)
Glucose, Bld: 90 mg/dL (ref 70–99)
Potassium: 3.5 mmol/L (ref 3.5–5.1)
Sodium: 134 mmol/L — ABNORMAL LOW (ref 135–145)
Total Bilirubin: 0.8 mg/dL (ref 0.3–1.2)
Total Protein: 6.2 g/dL — ABNORMAL LOW (ref 6.5–8.1)

## 2020-11-17 LAB — CBC
HCT: 32.9 % — ABNORMAL LOW (ref 36.0–46.0)
Hemoglobin: 11.2 g/dL — ABNORMAL LOW (ref 12.0–15.0)
MCH: 29.1 pg (ref 26.0–34.0)
MCHC: 34 g/dL (ref 30.0–36.0)
MCV: 85.5 fL (ref 80.0–100.0)
Platelets: 259 10*3/uL (ref 150–400)
RBC: 3.85 MIL/uL — ABNORMAL LOW (ref 3.87–5.11)
RDW: 13.1 % (ref 11.5–15.5)
WBC: 8.7 10*3/uL (ref 4.0–10.5)
nRBC: 0 % (ref 0.0–0.2)

## 2020-11-17 MED ORDER — FENTANYL-BUPIVACAINE-NACL 0.5-0.125-0.9 MG/250ML-% EP SOLN
12.0000 mL/h | EPIDURAL | Status: DC | PRN
  Administered 2020-11-17: 12 mL/h via EPIDURAL
  Filled 2020-11-17: qty 250

## 2020-11-17 MED ORDER — TERBUTALINE SULFATE 1 MG/ML IJ SOLN: 0.2500 mg | Freq: Once | INTRAMUSCULAR | Status: DC | PRN

## 2020-11-17 MED ORDER — LABETALOL HCL 5 MG/ML IV SOLN: 80.0000 mg | INTRAVENOUS | Status: DC | PRN

## 2020-11-17 MED ORDER — LABETALOL HCL 5 MG/ML IV SOLN
20.0000 mg | INTRAVENOUS | Status: DC | PRN
  Administered 2020-11-18: 20 mg via INTRAVENOUS
  Filled 2020-11-17: qty 4

## 2020-11-17 MED ORDER — ONDANSETRON HCL 4 MG/2ML IJ SOLN: 4.0000 mg | Freq: Four times a day (QID) | INTRAMUSCULAR | Status: DC | PRN

## 2020-11-17 MED ORDER — OXYTOCIN BOLUS FROM INFUSION
333.0000 mL | Freq: Once | INTRAVENOUS | Status: AC
  Administered 2020-11-18: 333 mL via INTRAVENOUS

## 2020-11-17 MED ORDER — OXYCODONE-ACETAMINOPHEN 5-325 MG PO TABS: 2.0000 | ORAL_TABLET | ORAL | Status: DC | PRN

## 2020-11-17 MED ORDER — DIPHENHYDRAMINE HCL 50 MG/ML IJ SOLN
12.5000 mg | INTRAMUSCULAR | Status: DC | PRN
  Administered 2020-11-18: 12.5 mg via INTRAVENOUS
  Filled 2020-11-17: qty 1

## 2020-11-17 MED ORDER — PHENYLEPHRINE 40 MCG/ML (10ML) SYRINGE FOR IV PUSH (FOR BLOOD PRESSURE SUPPORT): 80.0000 ug | PREFILLED_SYRINGE | INTRAVENOUS | Status: DC | PRN

## 2020-11-17 MED ORDER — SOD CITRATE-CITRIC ACID 500-334 MG/5ML PO SOLN: 30.0000 mL | ORAL | Status: DC | PRN

## 2020-11-17 MED ORDER — ACETAMINOPHEN 325 MG PO TABS: 650.0000 mg | ORAL_TABLET | ORAL | Status: DC | PRN

## 2020-11-17 MED ORDER — EPHEDRINE 5 MG/ML INJ: 10.0000 mg | INTRAVENOUS | Status: DC | PRN

## 2020-11-17 MED ORDER — LACTATED RINGERS IV SOLN: INTRAVENOUS | Status: DC

## 2020-11-17 MED ORDER — OXYTOCIN-SODIUM CHLORIDE 30-0.9 UT/500ML-% IV SOLN
1.0000 m[IU]/min | INTRAVENOUS | Status: DC
  Administered 2020-11-18: 2 m[IU]/min via INTRAVENOUS
  Filled 2020-11-17: qty 500

## 2020-11-17 MED ORDER — PENICILLIN G POT IN DEXTROSE 60000 UNIT/ML IV SOLN: 3.0000 10*6.[IU] | INTRAVENOUS | Status: DC

## 2020-11-17 MED ORDER — PHENYLEPHRINE 40 MCG/ML (10ML) SYRINGE FOR IV PUSH (FOR BLOOD PRESSURE SUPPORT)
80.0000 ug | PREFILLED_SYRINGE | INTRAVENOUS | Status: DC | PRN
  Filled 2020-11-17: qty 10

## 2020-11-17 MED ORDER — HYDRALAZINE HCL 20 MG/ML IJ SOLN: 10.0000 mg | INTRAMUSCULAR | Status: DC | PRN

## 2020-11-17 MED ORDER — LIDOCAINE-EPINEPHRINE (PF) 2 %-1:200000 IJ SOLN
INTRAMUSCULAR | Status: DC | PRN
  Administered 2020-11-17: 4 mL via EPIDURAL

## 2020-11-17 MED ORDER — LACTATED RINGERS IV SOLN
500.0000 mL | INTRAVENOUS | Status: DC | PRN
Start: 2020-11-17 — End: 2020-11-18

## 2020-11-17 MED ORDER — OXYTOCIN-SODIUM CHLORIDE 30-0.9 UT/500ML-% IV SOLN
2.5000 [IU]/h | INTRAVENOUS | Status: DC
  Administered 2020-11-18: 2.5 [IU]/h via INTRAVENOUS

## 2020-11-17 MED ORDER — LABETALOL HCL 5 MG/ML IV SOLN: 40.0000 mg | INTRAVENOUS | Status: DC | PRN

## 2020-11-17 MED ORDER — OXYCODONE-ACETAMINOPHEN 5-325 MG PO TABS: 1.0000 | ORAL_TABLET | ORAL | Status: DC | PRN

## 2020-11-17 MED ORDER — SOD CITRATE-CITRIC ACID 500-334 MG/5ML PO SOLN
ORAL | Status: AC
  Filled 2020-11-17: qty 15

## 2020-11-17 MED ORDER — SODIUM CHLORIDE 0.9 % IV SOLN
5.0000 10*6.[IU] | Freq: Once | INTRAVENOUS | Status: AC
  Administered 2020-11-18: 5 10*6.[IU] via INTRAVENOUS
  Filled 2020-11-17: qty 5

## 2020-11-17 MED ORDER — LIDOCAINE HCL (PF) 1 % IJ SOLN: 30.0000 mL | INTRAMUSCULAR | Status: DC | PRN

## 2020-11-17 MED ORDER — LACTATED RINGERS IV SOLN: 500.0000 mL | Freq: Once | INTRAVENOUS | Status: DC

## 2020-11-17 NOTE — Anesthesia Procedure Notes (Signed)
Epidural Patient location during procedure: OB Start time: 11/17/2020 11:10 PM End time: 11/17/2020 11:20 PM  Staffing Anesthesiologist: Elmer Picker, MD Performed: anesthesiologist   Preanesthetic Checklist Completed: patient identified, IV checked, risks and benefits discussed, monitors and equipment checked, pre-op evaluation and timeout performed  Epidural Patient position: sitting Prep: DuraPrep and site prepped and draped Patient monitoring: continuous pulse ox, blood pressure, heart rate and cardiac monitor Approach: midline Location: L3-L4 Injection technique: LOR air  Needle:  Needle type: Tuohy  Needle gauge: 17 G Needle length: 9 cm Needle insertion depth: 4 cm Catheter type: closed end flexible Catheter size: 19 Gauge Catheter at skin depth: 10 cm Test dose: negative  Assessment Sensory level: T8 Events: blood not aspirated, injection not painful, no injection resistance, no paresthesia and negative IV test  Additional Notes Patient identified. Risks/Benefits/Options discussed with patient including but not limited to bleeding, infection, nerve damage, paralysis, failed block, incomplete pain control, headache, blood pressure changes, nausea, vomiting, reactions to medication both or allergic, itching and postpartum back pain. Confirmed with bedside nurse the patient's most recent platelet count. Confirmed with patient that they are not currently taking any anticoagulation, have any bleeding history or any family history of bleeding disorders. Patient expressed understanding and wished to proceed. All questions were answered. Sterile technique was used throughout the entire procedure. Please see nursing notes for vital signs. Test dose was given through epidural catheter and negative prior to continuing to dose epidural or start infusion. Warning signs of high block given to the patient including shortness of breath, tingling/numbness in hands, complete motor block,  or any concerning symptoms with instructions to call for help. Patient was given instructions on fall risk and not to get out of bed. All questions and concerns addressed with instructions to call with any issues or inadequate analgesia.  Reason for block:procedure for pain

## 2020-11-17 NOTE — Anesthesia Preprocedure Evaluation (Addendum)
Anesthesia Evaluation  Patient identified by MRN, date of birth, ID band Patient awake    Reviewed: Allergy & Precautions, NPO status , Patient's Chart, lab work & pertinent test results  Airway Mallampati: II  TM Distance: >3 FB Neck ROM: Full    Dental no notable dental hx. (+) Teeth Intact, Dental Advisory Given   Pulmonary asthma , Current SmokerPatient did not abstain from smoking.,    Pulmonary exam normal breath sounds clear to auscultation       Cardiovascular hypertension, Pt. on medications Normal cardiovascular exam Rhythm:Regular Rate:Normal     Neuro/Psych PSYCHIATRIC DISORDERS Anxiety negative neurological ROS     GI/Hepatic negative GI ROS, Neg liver ROS,   Endo/Other  negative endocrine ROS  Renal/GU negative Renal ROS  negative genitourinary   Musculoskeletal negative musculoskeletal ROS (+)   Abdominal   Peds  Hematology negative hematology ROS (+)   Anesthesia Other Findings IOL 2/2 bleeding c/f abruption and severe range BP  Reproductive/Obstetrics (+) Pregnancy                            Anesthesia Physical Anesthesia Plan  ASA: III  Anesthesia Plan: Epidural   Post-op Pain Management:    Induction:   PONV Risk Score and Plan: Treatment may vary due to age or medical condition  Airway Management Planned: Natural Airway  Additional Equipment:   Intra-op Plan:   Post-operative Plan:   Informed Consent: I have reviewed the patients History and Physical, chart, labs and discussed the procedure including the risks, benefits and alternatives for the proposed anesthesia with the patient or authorized representative who has indicated his/her understanding and acceptance.       Plan Discussed with: Anesthesiologist  Anesthesia Plan Comments: (Patient identified. Risks, benefits, options discussed with patient including but not limited to bleeding, infection,  nerve damage, paralysis, failed block, incomplete pain control, headache, blood pressure changes, nausea, vomiting, reactions to medication, itching, and post partum back pain. Confirmed with bedside nurse the patient's most recent platelet count. Confirmed with the patient that they are not taking any anticoagulation, have any bleeding history or any family history of bleeding disorders. Patient expressed understanding and wishes to proceed. All questions were answered. )        Anesthesia Quick Evaluation

## 2020-11-17 NOTE — MAU Note (Signed)
Pt reports to MAU via EMS reports vaginal bleeding since 8pm. Abdominal pain shortly after. +FM.

## 2020-11-17 NOTE — H&P (Signed)
OBSTETRIC ADMISSION HISTORY AND PHYSICAL  Taylor Sherman is a 35 y.o. female [phone] with IUP at 50w0dby L/9 presenting for concern of vaginal bleeding and contractions. She reports +FMs, No LOF, no blurry vision, headaches or peripheral edema, and RUQ pain.  She plans on breast feeding. She desires postpartum BTL for birth control.  [facility] --->  Estimated Date of Delivery: 12/15/20  Sono:  _0 , CWD, normal anatomy, cephalic presentation, [id number] 8% EFW, increased S/D ratio on Doppler  Prenatal History/Complications:  - cHTN (nifedipine 330mdaily in pregnancy) - IUGR (EFW 8% at 3378w1dncreased S/D ratio on Doppler) - Tobacco use in pregnancy - H/o LEEP (CIN III) - Silent Alpha thal  Past Medical History: Past Medical History:  Diagnosis Date  . Anxiety   . Asthma   . Hypertension   . PTSD (post-traumatic stress disorder)     Past Surgical History: Past Surgical History:  Procedure Laterality Date  . LEEP      Obstetrical History: OB History    Gravida  6   Para  3   Term  2   Preterm  1   AB  2   Living  3     SAB  2   IAB      Ectopic      Multiple      Live Births  3           Social History Social History   Socioeconomic History  . Marital status: Single    Spouse name: Not on file  . Number of children: Not on file  . Years of education: Not on file  . Highest education level: Not on file  Occupational History  . Not on file  Tobacco Use  . Smoking status: Current Every Day Smoker  . Smokeless tobacco: Never Used  . Tobacco comment: about 2 cigs a day  Vaping Use  . Vaping Use: Never used  Substance and Sexual Activity  . Alcohol use: Not Currently    Comment: Last drink was a month ago  . Drug use: No  . Sexual activity: Yes    Partners: Male    Birth control/protection: None  Other Topics Concern  . Not on file  Social History Narrative  . Not on file   Social  Determinants of Health   Financial Resource Strain: Not on file  Food Insecurity: Not on file  Transportation Needs: Not on file  Physical Activity: Not on file  Stress: Not on file  Social Connections: Not on file    Family History: Family History  Problem Relation Age of Onset  . Hypertension Mother   . Diabetes Mother   . Hypertension Maternal Grandmother   . Heart disease Maternal Grandmother     Allergies: No Known Allergies  Medications Prior to Admission  Medication Sig Dispense Refill Last Dose  . NIFEdipine (PROCARDIA-XL/NIFEDICAL-XL) 30 MG 24 hr tablet Take 1 tablet (30 mg total) by mouth daily. 30 tablet 8 11/17/2020 at Unknown time  . acetaminophen (TYLENOL) 325 MG tablet Take 650 mg by mouth every 6 (six) hours as needed for mild pain, fever or headache.     . aMarland Kitchenpirin 81 MG chewable tablet Chew 1 tablet (81 mg total) by mouth daily. (Patient not taking: No sig reported) 30 tablet 8   . Blood Pressure Monitoring (BLOOD PRESSURE KIT) DEVI 1 kit by Does not apply route once a week.  1 each 0   . cyclobenzaprine (FLEXERIL) 10 MG tablet Take 1 tablet (10 mg total) by mouth 3 (three) times daily as needed for muscle spasms. 30 tablet 0   . Doxylamine-Pyridoxine (DICLEGIS) 10-10 MG TBEC Take 2 tabs qhs then add 1 tab A.M and midday prn 90 tablet 6   . polyethylene glycol powder (GLYCOLAX/MIRALAX) 17 GM/SCOOP powder Take 17 g by mouth daily. 255 g 0   . Prenatal Vit-Fe Phos-FA-Omega (VITAFOL GUMMIES) 3.33-0.333-34.8 MG CHEW Chew 3 tablets by mouth daily. 90 tablet 12      Review of Systems  All systems reviewed and negative except as stated in HPI  Blood pressure 139/71, pulse 88, temperature 97.9 F (36.6 C), temperature source Oral, resp. rate 18, last menstrual period 03/10/2020, SpO2 99 %. General appearance: alert, cooperative and appears stated age, breathing heavily through contractions Lungs: normal WOB Heart: regular rate Abdomen: soft, non-tender,  gravid Extremities:  no sign of DVT Presentation: cephalic on ultrasound Fetal monitoringBaseline: 130 bpm, Variability: Good {> 6 bpm), Accelerations: Reactive and Decelerations: Absent Uterine activityFrequency: Every 5 minutes Dilation: 3 Effacement (%): 90 Station: -1 Exam by:: Dr. Astrid Drafts  Prenatal labs: ABO, Rh: --/--/AB POS (03/15 2149) Antibody: NEG (03/15 2149) Rubella: 6.11 (09/29 1521) RPR: Non Reactive (01/31 1038)  HBsAg: Negative (09/29 1521)  HIV: Non Reactive (01/31 1038)  GBS:   unknown (collected on admission) 2 hr Glucola wnl Genetic screening  wnl except for alpha thal Anatomy US wnl except for EFW 8%, increased S/D ratio  Prenatal Transfer Tool  Maternal Diabetes: No Genetic Screening: Normal except for silent alpha thal carrier Maternal Ultrasounds/Referrals: IUGR Fetal Ultrasounds or other Referrals:  Referred to Materal Fetal Medicine  (IUGR) Maternal Substance Abuse:  Yes:  Type: Smoker Significant Maternal Medications:  Meds include: Other: nifedipine Significant Maternal Lab Results: Other: GBS unknown  Results for orders placed or performed during the hospital encounter of 11/17/20 (from the past 24 hour(s))  CBC   Collection Time: 11/17/20  9:49 PM  Result Value Ref Range   WBC 8.7 4.0 - 10.5 K/uL   RBC 3.85 (L) 3.87 - 5.11 MIL/uL   Hemoglobin 11.2 (L) 12.0 - 15.0 g/dL   HCT 32.9 (L) 36.0 - 46.0 %   MCV 85.5 80.0 - 100.0 fL   MCH 29.1 26.0 - 34.0 pg   MCHC 34.0 30.0 - 36.0 g/dL   RDW 13.1 11.5 - 15.5 %   Platelets 259 150 - 400 K/uL   nRBC 0.0 0.0 - 0.2 %  Comprehensive metabolic panel   Collection Time: 11/17/20  9:49 PM  Result Value Ref Range   Sodium 134 (L) 135 - 145 mmol/L   Potassium 3.5 3.5 - 5.1 mmol/L   Chloride 104 98 - 111 mmol/L   CO2 22 22 - 32 mmol/L   Glucose, Bld 90 70 - 99 mg/dL   BUN <5 (L) 6 - 20 mg/dL   Creatinine, Ser 0.68 0.44 - 1.00 mg/dL   Calcium 8.6 (L) 8.9 - 10.3 mg/dL   Total Protein 6.2 (L) 6.5 - 8.1  g/dL   Albumin 2.8 (L) 3.5 - 5.0 g/dL   AST 21 15 - 41 U/L   ALT 18 0 - 44 U/L   Alkaline Phosphatase 109 38 - 126 U/L   Total Bilirubin 0.8 0.3 - 1.2 mg/dL   GFR, Estimated >60 >60 mL/min   Anion gap 8 5 - 15  Type and screen   Collection Time: 11/17/20  9:49  PM  Result Value Ref Range   ABO/RH(D) AB POS    Antibody Screen NEG    Sample Expiration      11/20/2020,2359 Performed at [facility], [place] [address] [place], [place] [address]   Resp Panel by RT-PCR (Flu A&B, Covid) Nasopharyngeal Swab   Collection Time: 11/17/20 10:13 PM   Specimen: Nasopharyngeal Swab; Nasopharyngeal(NP) swabs in vial transport medium  Result Value Ref Range   SARS Coronavirus 2 by RT PCR NEGATIVE NEGATIVE   Influenza A by PCR NEGATIVE NEGATIVE   Influenza B by PCR NEGATIVE NEGATIVE    Patient Active Problem List   Diagnosis Date Noted  . Supervision of high risk pregnancy, antepartum 11/17/2020  . [redacted] weeks gestation of pregnancy 10/05/2020  . Hx LEEP (loop electrosurgical excision procedure), cervix, pregnancy, first trimester 06/03/2020  . [redacted] weeks gestation of pregnancy 06/03/2020  . Encounter for supervision of normal pregnancy, unspecified, first trimester 05/19/2020  . Chronic pre-existing hypertension during pregnancy 02/24/2018  . Cervical intraepithelial neoplasia grade III with severe dysplasia 02/15/2018    Assessment/Plan:  Taylor Sherman is a 35 y.o. [id number] at 76w0dhere for concern of abdominal pain, vaginal bleeding consistent with placental abruption on exam. Given Category 1 strip with stabilization of vaginal bleeding, will admit for IOL. Pt also consented for possible Cesarean given potential for fetal decompensation in setting of placental abruption.  #Preterm IOL secondary to Placental Abruption: BMZ and penicillin ordered on admission. Given initial cervical exam, pt started on pitocin with plans for early AROM given multiparity. Given placental abruption, pt was  consented for Cesarean as noted below. Will continue with plan for IOL at this time as noted above. Dr. CElly Modenaaware of plan.  The risks of cesarean section were discussed with the patient including but were not limited to: bleeding which may require transfusion or reoperation; infection which may require antibiotics; injury to bowel, bladder, ureters or other surrounding organs; injury to the fetus; need for additional procedures including hysterectomy in the event of a life-threatening hemorrhage; placental abnormalities wth subsequent pregnancies, incisional problems, thromboembolic phenomenon and other postoperative/anesthesia complications.  Patient also desires permanent sterilization.  Other reversible forms of contraception were discussed with patient; she declines all other modalities. Risks of procedure discussed with patient including but not limited to: risk of regret, permanence of method, bleeding, infection, injury to surrounding organs and need for additional procedures.  Failure risk of about 1% with increased risk of ectopic gestation if pregnancy occurs was also discussed with patient.  Also discussed possibility of post-tubal pain syndrome.  #Pain: Plan for epidural on arrival to L&D per pt request #FWB: Category 1 strip #ID: GBS unknown; collected on admission (however, likely unable to run sample given gross vaginal bleeding) #MOF: desires breast #MOC: desires BTL. Consent form signed 10/05/20. Pt consented on admission. #Circ: n/a #Tobacco use: consider nicotine patch per pt preference. Given abruption, UDS also collected. #cHTN: nifedipine 376mdaily in pregnancy. No signs/symptoms of preeclampsia. BP mild range on admission. F/u preeclampsia labs on admission.  AnRanda NgoMD  11/18/2020, 2:21 AM

## 2020-11-18 ENCOUNTER — Ambulatory Visit: Payer: Medicaid Other

## 2020-11-18 ENCOUNTER — Encounter (HOSPITAL_COMMUNITY): Payer: Self-pay | Admitting: Obstetrics and Gynecology

## 2020-11-18 ENCOUNTER — Other Ambulatory Visit: Payer: Self-pay

## 2020-11-18 DIAGNOSIS — O99320 Drug use complicating pregnancy, unspecified trimester: Secondary | ICD-10-CM

## 2020-11-18 DIAGNOSIS — O365990 Maternal care for other known or suspected poor fetal growth, unspecified trimester, not applicable or unspecified: Secondary | ICD-10-CM

## 2020-11-18 DIAGNOSIS — Z3A36 36 weeks gestation of pregnancy: Secondary | ICD-10-CM

## 2020-11-18 DIAGNOSIS — F1290 Cannabis use, unspecified, uncomplicated: Secondary | ICD-10-CM

## 2020-11-18 DIAGNOSIS — O99330 Smoking (tobacco) complicating pregnancy, unspecified trimester: Secondary | ICD-10-CM

## 2020-11-18 DIAGNOSIS — O99324 Drug use complicating childbirth: Secondary | ICD-10-CM

## 2020-11-18 DIAGNOSIS — O4593 Premature separation of placenta, unspecified, third trimester: Secondary | ICD-10-CM

## 2020-11-18 DIAGNOSIS — O1410 Severe pre-eclampsia, unspecified trimester: Secondary | ICD-10-CM

## 2020-11-18 DIAGNOSIS — F1490 Cocaine use, unspecified, uncomplicated: Secondary | ICD-10-CM

## 2020-11-18 DIAGNOSIS — O1092 Unspecified pre-existing hypertension complicating childbirth: Secondary | ICD-10-CM

## 2020-11-18 DIAGNOSIS — F1410 Cocaine abuse, uncomplicated: Secondary | ICD-10-CM

## 2020-11-18 LAB — RAPID URINE DRUG SCREEN, HOSP PERFORMED
Amphetamines: NOT DETECTED
Barbiturates: NOT DETECTED
Benzodiazepines: NOT DETECTED
Cocaine: POSITIVE — AB
Opiates: NOT DETECTED
Tetrahydrocannabinol: POSITIVE — AB

## 2020-11-18 LAB — PROTEIN / CREATININE RATIO, URINE
Creatinine, Urine: 23.8 mg/dL
Protein Creatinine Ratio: 0.88 mg/mg{Cre} — ABNORMAL HIGH (ref 0.00–0.15)
Total Protein, Urine: 21 mg/dL

## 2020-11-18 LAB — RESP PANEL BY RT-PCR (FLU A&B, COVID) ARPGX2
Influenza A by PCR: NEGATIVE
Influenza B by PCR: NEGATIVE
SARS Coronavirus 2 by RT PCR: NEGATIVE

## 2020-11-18 LAB — RPR: RPR Ser Ql: NONREACTIVE

## 2020-11-18 MED ORDER — LABETALOL HCL 5 MG/ML IV SOLN
40.0000 mg | INTRAVENOUS | Status: DC | PRN
  Administered 2020-11-18: 40 mg via INTRAVENOUS
  Filled 2020-11-18: qty 8

## 2020-11-18 MED ORDER — LACTATED RINGERS IV SOLN: INTRAVENOUS | Status: DC

## 2020-11-18 MED ORDER — NIFEDIPINE ER OSMOTIC RELEASE 30 MG PO TB24
60.0000 mg | ORAL_TABLET | Freq: Every day | ORAL | Status: DC
  Administered 2020-11-18: 60 mg via ORAL
  Filled 2020-11-18: qty 1

## 2020-11-18 MED ORDER — PRENATAL MULTIVITAMIN CH
1.0000 | ORAL_TABLET | Freq: Every day | ORAL | Status: DC
  Administered 2020-11-18 – 2020-11-21 (×3): 1 via ORAL
  Filled 2020-11-18 (×3): qty 1

## 2020-11-18 MED ORDER — NIFEDIPINE ER OSMOTIC RELEASE 30 MG PO TB24
90.0000 mg | ORAL_TABLET | Freq: Every day | ORAL | Status: DC
  Filled 2020-11-18: qty 3

## 2020-11-18 MED ORDER — DIBUCAINE (PERIANAL) 1 % EX OINT
1.0000 | TOPICAL_OINTMENT | CUTANEOUS | Status: DC | PRN
Start: 2020-11-18 — End: 2020-11-21

## 2020-11-18 MED ORDER — SIMETHICONE 80 MG PO CHEW: 80.0000 mg | CHEWABLE_TABLET | ORAL | Status: DC | PRN

## 2020-11-18 MED ORDER — DIPHENHYDRAMINE HCL 25 MG PO CAPS
25.0000 mg | ORAL_CAPSULE | Freq: Four times a day (QID) | ORAL | Status: DC | PRN
  Administered 2020-11-18: 25 mg via ORAL
  Filled 2020-11-18 (×2): qty 1

## 2020-11-18 MED ORDER — LABETALOL HCL 5 MG/ML IV SOLN
80.0000 mg | INTRAVENOUS | Status: DC | PRN
Start: 2020-11-18 — End: 2020-11-21

## 2020-11-18 MED ORDER — BETAMETHASONE SOD PHOS & ACET 6 (3-3) MG/ML IJ SUSP
12.0000 mg | Freq: Once | INTRAMUSCULAR | Status: AC
  Administered 2020-11-18: 12 mg via INTRAMUSCULAR
  Filled 2020-11-18: qty 5

## 2020-11-18 MED ORDER — MAGNESIUM SULFATE BOLUS VIA INFUSION
4.0000 g | Freq: Once | INTRAVENOUS | Status: AC
  Filled 2020-11-18: qty 1000

## 2020-11-18 MED ORDER — BENZOCAINE-MENTHOL 20-0.5 % EX AERO: 1.0000 "application " | INHALATION_SPRAY | CUTANEOUS | Status: DC | PRN

## 2020-11-18 MED ORDER — TETANUS-DIPHTH-ACELL PERTUSSIS 5-2.5-18.5 LF-MCG/0.5 IM SUSY
0.5000 mL | PREFILLED_SYRINGE | Freq: Once | INTRAMUSCULAR | Status: AC
  Administered 2020-11-21: 0.5 mL via INTRAMUSCULAR
  Filled 2020-11-18: qty 0.5

## 2020-11-18 MED ORDER — COCONUT OIL OIL: 1.0000 "application " | TOPICAL_OIL | Status: DC | PRN

## 2020-11-18 MED ORDER — IBUPROFEN 600 MG PO TABS
600.0000 mg | ORAL_TABLET | Freq: Four times a day (QID) | ORAL | Status: DC
  Administered 2020-11-18 – 2020-11-21 (×9): 600 mg via ORAL
  Filled 2020-11-18 (×9): qty 1

## 2020-11-18 MED ORDER — WITCH HAZEL-GLYCERIN EX PADS: 1.0000 "application " | MEDICATED_PAD | CUTANEOUS | Status: DC | PRN

## 2020-11-18 MED ORDER — ONDANSETRON HCL 4 MG/2ML IJ SOLN: 4.0000 mg | INTRAMUSCULAR | Status: DC | PRN

## 2020-11-18 MED ORDER — ACETAMINOPHEN 325 MG PO TABS
650.0000 mg | ORAL_TABLET | Freq: Four times a day (QID) | ORAL | Status: DC
  Administered 2020-11-18 – 2020-11-21 (×9): 650 mg via ORAL
  Filled 2020-11-18 (×9): qty 2

## 2020-11-18 MED ORDER — ONDANSETRON HCL 4 MG PO TABS: 4.0000 mg | ORAL_TABLET | ORAL | Status: DC | PRN

## 2020-11-18 MED ORDER — MAGNESIUM SULFATE 40 GM/1000ML IV SOLN
2.0000 g/h | INTRAVENOUS | Status: AC
  Filled 2020-11-18: qty 1000

## 2020-11-18 MED ORDER — NIFEDIPINE ER OSMOTIC RELEASE 30 MG PO TB24
30.0000 mg | ORAL_TABLET | Freq: Every day | ORAL | Status: DC
  Filled 2020-11-18: qty 1

## 2020-11-18 MED ORDER — SENNOSIDES-DOCUSATE SODIUM 8.6-50 MG PO TABS
2.0000 | ORAL_TABLET | Freq: Every day | ORAL | Status: DC
  Administered 2020-11-20 – 2020-11-21 (×2): 2 via ORAL
  Filled 2020-11-18 (×2): qty 2

## 2020-11-18 MED ORDER — ENALAPRIL MALEATE 5 MG PO TABS
5.0000 mg | ORAL_TABLET | Freq: Every day | ORAL | Status: DC
  Administered 2020-11-18: 5 mg via ORAL
  Filled 2020-11-18: qty 1

## 2020-11-18 MED ORDER — NIFEDIPINE ER OSMOTIC RELEASE 30 MG PO TB24
30.0000 mg | ORAL_TABLET | Freq: Once | ORAL | Status: AC
  Administered 2020-11-18: 30 mg via ORAL
  Filled 2020-11-18: qty 1

## 2020-11-18 MED ORDER — LABETALOL HCL 5 MG/ML IV SOLN
20.0000 mg | INTRAVENOUS | Status: DC | PRN
  Administered 2020-11-18: 20 mg via INTRAVENOUS
  Filled 2020-11-18: qty 4

## 2020-11-18 MED ORDER — AMLODIPINE BESYLATE 5 MG PO TABS: 5.0000 mg | ORAL_TABLET | Freq: Every day | ORAL | Status: DC

## 2020-11-18 MED ORDER — HYDRALAZINE HCL 20 MG/ML IJ SOLN
10.0000 mg | INTRAMUSCULAR | Status: DC | PRN
Start: 2020-11-18 — End: 2020-11-21

## 2020-11-18 MED ORDER — MAGNESIUM SULFATE 40 GM/1000ML IV SOLN
INTRAVENOUS | Status: AC
  Administered 2020-11-18: 4 g via INTRAVENOUS
  Filled 2020-11-18: qty 1000

## 2020-11-18 NOTE — Progress Notes (Signed)
Labor Progress Note Taylor Sherman is a 35 y.o. [id number] at [redacted]w[redacted]d presented for concern of vaginal bleeding, abdominal pain and contractions. Clinical presentation consistent with placental abruption in the setting of cHTN and +cocaine on UDS.  S: Pt resting comfortably with epidural in place. No headache, vision changes or other concerns.  O:  BP (!) 147/84   Pulse 82   Temp 97.9 F (36.6 C) (Oral)   Resp 18   LMP 03/10/2020   SpO2 99%  EFM: baseline 130/moderate variability/no accels/no decels  CVE: Dilation: 3 Effacement (%): 90 Station: -1 Presentation: Vertex Exam by:: Dr. Barb Merino   A&P: 35 y.o. [id number] [redacted]w[redacted]d presented for concern of vaginal bleeding, abdominal pain and contractions. Clinical presentation consistent with placental abruption in the setting of cHTN and +cocaine on UDS. #Preterm IOL for Placental Abruption: Progressing well with good fetal tolerance. Pitocin started at 0030. AROM for clear fluid at 0255. Will plan to continue to up-titrate pitocin as clinically indicated. Will plan to recheck cervical exam in 4 hours or sooner as clinically indicated. #Pain: epidural in place #FWB: Category 1 strip #GBS unknown; pencillin ordered on admission #cHTN: BP mild range. Preeclampsia labs wnl on admission. Asymptomatic. Will continue to monitor. #Cocaine Use: +UDS on admission. Will plan for discussion regarding contraindication for breastfeeding. SW consult in postpartum period. #Tobacco use: pt declines nicotine patch. #Anxiety  PTSD: Plan SW consult and 1 week mood check in clinic.  Sheila Oats, MD 3:00 AM

## 2020-11-18 NOTE — Progress Notes (Signed)
BP 161/102, 167/106. 20mg  Labetalol given IV as well as Vasotec and Procardia PO. Dr. is aware. Earlene Plater, RN

## 2020-11-18 NOTE — Anesthesia Postprocedure Evaluation (Signed)
Anesthesia Post Note  Patient: Taylor Sherman  Procedure(s) Performed: AN AD HOC LABOR EPIDURAL     Patient location during evaluation: Mother Baby Anesthesia Type: Epidural Level of consciousness: awake and alert Pain management: pain level controlled Vital Signs Assessment: post-procedure vital signs reviewed and stable Respiratory status: spontaneous breathing, nonlabored ventilation and respiratory function stable Cardiovascular status: stable Postop Assessment: no headache, no backache and epidural receding Anesthetic complications: no   No complications documented.  Last Vitals:  Vitals:   11/18/20 1223 11/18/20 1330  BP: (!) 151/109 (!) 150/98  Pulse: 79 84  Resp:    Temp:    SpO2:      Last Pain:  Vitals:   11/18/20 1230  TempSrc:   PainSc: 2    Pain Goal: Patients Stated Pain Goal: 0 (11/17/20 2228)                 Shanese Riemenschneider

## 2020-11-18 NOTE — Discharge Instructions (Signed)

## 2020-11-18 NOTE — Discharge Summary (Signed)
Postpartum Discharge Summary      Patient Name: Taylor Sherman DOB: [birth year] MRN: [id number]  Date of admission: 11/17/2020 Delivery date:11/18/2020  Delivering provider: Randa Ngo  Date of discharge: 11/21/2020  Admitting diagnosis: Supervision of high risk pregnancy, antepartum [O09.90] Intrauterine pregnancy: [redacted]w[redacted]d    Secondary diagnosis:  Active Problems:   Chronic pre-existing hypertension during pregnancy   Hx LEEP (loop electrosurgical excision procedure), cervix, pregnancy, first trimester   Supervision of high risk pregnancy, antepartum   Cocaine use complicating pregnancy   Marijuana use   Tobacco use in pregnancy   IUGR (intrauterine growth restriction) affecting care of mother   Vaginal delivery   Severe preeclampsia  Additional problems: as noted above  Discharge diagnosis: Preterm Pregnancy Delivered                                              Post partum procedures:postpartum tubal ligation Augmentation: AROM and Pitocin Complications: Placental Abruption  Hospital course: Induction of Labor With Vaginal Delivery   35y.o. yo [id number] 362w1das admitted to the hospital 11/17/2020 for induction of labor.  Indication for induction: placental abruption, chronic hypertension with superimposed preeclampsia with severe features.  Patient had an uncomplicated labor course as follows: Membrane Rupture Time/Date: 2:54 AM ,11/18/2020   Delivery Method:Vaginal, Spontaneous  Episiotomy: None  Lacerations:  None  Details of delivery can be found in separate delivery note. She received 24 hrs of magnesium therapy and anti-hypertensives were titrated.  Patient had a routine postpartum course. Patient is discharged home 11/21/20.  Newborn Data: Birth date:11/18/2020  Birth time:4:11 AM  Gender:Female  Living status:Living  Apgars:8 ,9  Weight:2291 g   Magnesium Sulfate received: No BMZ received: Yes Rhophylac:N/A MMR:N/A T-DaP:offered prior to  discharge Flu: offered prior to discharge Transfusion:No  Physical exam  Vitals:   11/20/20 2038 11/21/20 0007 11/21/20 0304 11/21/20 0758  BP: 129/79 127/71 (!) 147/84 129/73  Pulse: 95 83 93 87  Resp: '16 18 20 19  ' Temp: 98.2 F (36.8 C) 98.5 F (36.9 C) 98.8 F (37.1 C) 97.7 F (36.5 C)  TempSrc: Oral Oral Oral Oral  SpO2: 99% 98% 99% 99%   General: alert, cooperative and no distress Lochia: appropriate Uterine Fundus: firm Incision: Healing well with no significant drainage DVT Evaluation: No evidence of DVT seen on physical exam. Labs: Lab Results  Component Value Date   WBC 14.5 (H) 11/19/2020   HGB 9.9 (L) 11/19/2020   HCT 28.7 (L) 11/19/2020   MCV 84.7 11/19/2020   PLT 280 11/19/2020   CMP Latest Ref Rng & Units 11/19/2020  Glucose 70 - 99 mg/dL 109(H)  BUN 6 - 20 mg/dL 5(L)  Creatinine 0.44 - 1.00 mg/dL 0.70  Sodium 135 - 145 mmol/L 129(L)  Potassium 3.5 - 5.1 mmol/L 4.2  Chloride 98 - 111 mmol/L 98  CO2 22 - 32 mmol/L 23  Calcium 8.9 - 10.3 mg/dL 6.8(L)  Total Protein 6.5 - 8.1 g/dL 5.4(L)  Total Bilirubin 0.3 - 1.2 mg/dL 0.6  Alkaline Phos 38 - 126 U/L 93  AST 15 - 41 U/L 21  ALT 0 - 44 U/L 17   Edinburgh Score: No flowsheet data found.   After visit meds:  Allergies as of 11/21/2020   No Known Allergies     Medication List    STOP taking  these medications   cyclobenzaprine 10 MG tablet Commonly known as: FLEXERIL   polyethylene glycol powder 17 GM/SCOOP powder Commonly known as: GLYCOLAX/MIRALAX     TAKE these medications   acetaminophen 325 MG tablet Commonly known as: TYLENOL Take 650 mg by mouth every 6 (six) hours as needed for mild pain, fever or headache.   aspirin 81 MG chewable tablet Chew 1 tablet (81 mg total) by mouth daily.   Blood Pressure Kit Devi 1 kit by Does not apply route once a week.   Doxylamine-Pyridoxine 10-10 MG Tbec Commonly known as: Diclegis Take 2 tabs qhs then add 1 tab A.M and midday prn What  changed:   how much to take  how to take this  when to take this   NIFEdipine 60 MG 24 hr tablet Commonly known as: ADALAT CC Take 1 tablet (60 mg total) by mouth daily. What changed: You were already taking a medication with the same name, and this prescription was added. Make sure you understand how and when to take each.   NIFEdipine 60 MG 24 hr tablet Commonly known as: ADALAT CC Take 1 tablet (60 mg total) by mouth daily. What changed:   medication strength  how much to take   Vitafol Gummies 3.33-0.333-34.8 MG Chew Chew 3 tablets by mouth daily.        Discharge home in stable condition Infant Feeding: Bottle  Infant Disposition:home with mother Discharge instruction: per After Visit Summary and Postpartum booklet. Activity: Advance as tolerated. Pelvic rest for 6 weeks.  Diet: routine diet Future Appointments: Future Appointments  Date Time Provider [place]  11/25/2020  1:40 PM [place] None  12/01/2020  3:00 PM Lynnea Ferrier, LCSW [facility] None  12/30/2020  1:00 PM Chancy Milroy, MD [place] None   Follow up Visit:  [place] Follow up in 3 day(s).   Specialty: Obstetrics and Gynecology Contact information: [address], [place] [phone]             Message sent to [facility] by Dr. Astrid Drafts  Please schedule this patient for a In person postpartum visit in 6 weeks with the following provider: Any provider. Additional Postpartum F/U:Postpartum Depression checkup and BP check 1 week  High risk pregnancy complicated by: Severe Preeclampsia, Preterm delivery in s/o placental abruption, Anxiety, +Cocaine Delivery mode:  Vaginal, Spontaneous  Anticipated Birth Control:  plan for PP BTL   11/21/2020 Donnamae Jude, MD

## 2020-11-19 ENCOUNTER — Encounter (HOSPITAL_COMMUNITY): Payer: Self-pay | Admitting: Obstetrics and Gynecology

## 2020-11-19 ENCOUNTER — Inpatient Hospital Stay (HOSPITAL_COMMUNITY): Payer: Medicaid Other | Admitting: Anesthesiology

## 2020-11-19 ENCOUNTER — Encounter (HOSPITAL_COMMUNITY)
Admission: AD | Disposition: A | Discharge status: Home / Self Care | Payer: Self-pay | Source: Home / Self Care | Attending: Obstetrics and Gynecology

## 2020-11-19 DIAGNOSIS — Z302 Encounter for sterilization: Secondary | ICD-10-CM

## 2020-11-19 DIAGNOSIS — O115 Pre-existing hypertension with pre-eclampsia, complicating the puerperium: Secondary | ICD-10-CM

## 2020-11-19 DIAGNOSIS — O1093 Unspecified pre-existing hypertension complicating the puerperium: Secondary | ICD-10-CM

## 2020-11-19 HISTORY — PX: TUBAL LIGATION: SHX77

## 2020-11-19 LAB — CBC
HCT: 28.7 % — ABNORMAL LOW (ref 36.0–46.0)
Hemoglobin: 9.9 g/dL — ABNORMAL LOW (ref 12.0–15.0)
MCH: 29.2 pg (ref 26.0–34.0)
MCHC: 34.5 g/dL (ref 30.0–36.0)
MCV: 84.7 fL (ref 80.0–100.0)
Platelets: 280 10*3/uL (ref 150–400)
RBC: 3.39 MIL/uL — ABNORMAL LOW (ref 3.87–5.11)
RDW: 12.8 % (ref 11.5–15.5)
WBC: 14.5 10*3/uL — ABNORMAL HIGH (ref 4.0–10.5)
nRBC: 0 % (ref 0.0–0.2)

## 2020-11-19 LAB — COMPREHENSIVE METABOLIC PANEL
ALT: 17 U/L (ref 0–44)
AST: 21 U/L (ref 15–41)
Albumin: 2.6 g/dL — ABNORMAL LOW (ref 3.5–5.0)
Alkaline Phosphatase: 93 U/L (ref 38–126)
Anion gap: 8 (ref 5–15)
BUN: 5 mg/dL — ABNORMAL LOW (ref 6–20)
CO2: 23 mmol/L (ref 22–32)
Calcium: 6.8 mg/dL — ABNORMAL LOW (ref 8.9–10.3)
Chloride: 98 mmol/L (ref 98–111)
Creatinine, Ser: 0.7 mg/dL (ref 0.44–1.00)
GFR, Estimated: >60 mL/min (ref >60)
Glucose, Bld: 109 mg/dL — ABNORMAL HIGH (ref 70–99)
Potassium: 4.2 mmol/L (ref 3.5–5.1)
Sodium: 129 mmol/L — ABNORMAL LOW (ref 135–145)
Total Bilirubin: 0.6 mg/dL (ref 0.3–1.2)
Total Protein: 5.4 g/dL — ABNORMAL LOW (ref 6.5–8.1)

## 2020-11-19 LAB — TYPE AND SCREEN
ABO/RH(D): AB POS
Antibody Screen: NEGATIVE

## 2020-11-19 SURGERY — LIGATION, FALLOPIAN TUBE, POSTPARTUM
Anesthesia: Epidural

## 2020-11-19 MED ORDER — ONDANSETRON HCL 4 MG/2ML IJ SOLN
INTRAMUSCULAR | Status: AC
  Filled 2020-11-19: qty 2

## 2020-11-19 MED ORDER — FENTANYL CITRATE (PF) 100 MCG/2ML IJ SOLN
INTRAMUSCULAR | Status: AC
  Filled 2020-11-19: qty 2

## 2020-11-19 MED ORDER — BUPIVACAINE HCL (PF) 0.5 % IJ SOLN
INTRAMUSCULAR | Status: AC
  Filled 2020-11-19: qty 30

## 2020-11-19 MED ORDER — ONDANSETRON HCL 4 MG/2ML IJ SOLN
INTRAMUSCULAR | Status: DC | PRN
  Administered 2020-11-19: 4 mg via INTRAVENOUS

## 2020-11-19 MED ORDER — BUPIVACAINE HCL 0.5 % IJ SOLN
INTRAMUSCULAR | Status: DC | PRN
  Administered 2020-11-19: 30 mL

## 2020-11-19 MED ORDER — TRANEXAMIC ACID-NACL 1000-0.7 MG/100ML-% IV SOLN
1000.0000 mg | INTRAVENOUS | Status: AC
  Administered 2020-11-19: 1000 mg via INTRAVENOUS

## 2020-11-19 MED ORDER — FENTANYL CITRATE (PF) 100 MCG/2ML IJ SOLN: 25.0000 ug | INTRAMUSCULAR | Status: DC | PRN

## 2020-11-19 MED ORDER — FENTANYL CITRATE (PF) 100 MCG/2ML IJ SOLN
INTRAMUSCULAR | Status: DC | PRN
  Administered 2020-11-19 (×2): 50 ug via EPIDURAL
  Administered 2020-11-19: 100 ug via EPIDURAL

## 2020-11-19 MED ORDER — OXYCODONE HCL 5 MG/5ML PO SOLN
5.0000 mg | Freq: Once | ORAL | Status: DC | PRN
Start: 2020-11-19 — End: 2020-11-19

## 2020-11-19 MED ORDER — ONDANSETRON HCL 4 MG/2ML IJ SOLN: 4.0000 mg | Freq: Once | INTRAMUSCULAR | Status: DC | PRN

## 2020-11-19 MED ORDER — STERILE WATER FOR IRRIGATION IR SOLN
Status: DC | PRN
  Administered 2020-11-19: 1

## 2020-11-19 MED ORDER — NIFEDIPINE ER OSMOTIC RELEASE 30 MG PO TB24
60.0000 mg | ORAL_TABLET | Freq: Every day | ORAL | Status: DC
  Administered 2020-11-19 – 2020-11-21 (×3): 60 mg via ORAL
  Filled 2020-11-19 (×4): qty 2

## 2020-11-19 MED ORDER — OXYCODONE-ACETAMINOPHEN 5-325 MG PO TABS: 1.0000 | ORAL_TABLET | Freq: Four times a day (QID) | ORAL | Status: DC | PRN

## 2020-11-19 MED ORDER — CARBOPROST TROMETHAMINE 250 MCG/ML IM SOLN
250.0000 ug | Freq: Once | INTRAMUSCULAR | Status: AC
  Administered 2020-11-19: 250 ug via INTRAMUSCULAR

## 2020-11-19 MED ORDER — TRANEXAMIC ACID-NACL 1000-0.7 MG/100ML-% IV SOLN
INTRAVENOUS | Status: AC
  Filled 2020-11-19: qty 100

## 2020-11-19 MED ORDER — MIDAZOLAM HCL 5 MG/5ML IJ SOLN
INTRAMUSCULAR | Status: DC | PRN
  Administered 2020-11-19 (×2): 1 mg via INTRAVENOUS

## 2020-11-19 MED ORDER — DEXAMETHASONE SODIUM PHOSPHATE 4 MG/ML IJ SOLN
INTRAMUSCULAR | Status: DC | PRN
  Administered 2020-11-19: 8 mg via INTRAVENOUS

## 2020-11-19 MED ORDER — LACTATED RINGERS IV SOLN: INTRAVENOUS | Status: DC | PRN

## 2020-11-19 MED ORDER — OXYCODONE HCL 5 MG PO TABS: 5.0000 mg | ORAL_TABLET | Freq: Once | ORAL | Status: DC | PRN

## 2020-11-19 MED ORDER — SODIUM BICARBONATE 8.4 % IV SOLN
INTRAVENOUS | Status: DC | PRN
  Administered 2020-11-19: 2 mL via EPIDURAL
  Administered 2020-11-19: 5 mL via EPIDURAL
  Administered 2020-11-19: 3 mL via EPIDURAL
  Administered 2020-11-19: 7 mL via EPIDURAL

## 2020-11-19 MED ORDER — CARBOPROST TROMETHAMINE 250 MCG/ML IM SOLN
INTRAMUSCULAR | Status: AC
  Filled 2020-11-19: qty 1

## 2020-11-19 MED ORDER — MIDAZOLAM HCL 2 MG/2ML IJ SOLN
INTRAMUSCULAR | Status: AC
  Filled 2020-11-19: qty 2

## 2020-11-19 MED ORDER — OXYCODONE-ACETAMINOPHEN 5-325 MG PO TABS
1.0000 | ORAL_TABLET | Freq: Four times a day (QID) | ORAL | Status: DC | PRN
  Administered 2020-11-19 – 2020-11-20 (×2): 1 via ORAL
  Filled 2020-11-19 (×2): qty 1

## 2020-11-19 MED ORDER — OXYCODONE HCL 5 MG PO TABS
5.0000 mg | ORAL_TABLET | Freq: Once | ORAL | Status: AC
  Administered 2020-11-19: 5 mg via ORAL
  Filled 2020-11-19: qty 1

## 2020-11-19 MED ORDER — METOCLOPRAMIDE HCL 5 MG/ML IJ SOLN
INTRAMUSCULAR | Status: AC
  Filled 2020-11-19: qty 2

## 2020-11-19 MED ORDER — SODIUM BICARBONATE 8.4 % IV SOLN
INTRAVENOUS | Status: AC
  Filled 2020-11-19: qty 50

## 2020-11-19 MED ORDER — METOCLOPRAMIDE HCL 5 MG/ML IJ SOLN
INTRAMUSCULAR | Status: DC | PRN
  Administered 2020-11-19: 10 mg via INTRAVENOUS

## 2020-11-19 MED ORDER — DEXAMETHASONE SODIUM PHOSPHATE 10 MG/ML IJ SOLN
INTRAMUSCULAR | Status: AC
  Filled 2020-11-19: qty 1

## 2020-11-19 MED ORDER — DIPHENOXYLATE-ATROPINE 2.5-0.025 MG PO TABS
2.0000 | ORAL_TABLET | Freq: Once | ORAL | Status: AC
  Administered 2020-11-19: 2 via ORAL
  Filled 2020-11-19: qty 2

## 2020-11-19 SURGICAL SUPPLY — 28 items
ADH SKN CLS APL DERMABOND .7 (GAUZE/BANDAGES/DRESSINGS) ×1
CLOTH BEACON ORANGE TIMEOUT ST (SAFETY) ×2 IMPLANT
DERMABOND ADVANCED (GAUZE/BANDAGES/DRESSINGS) ×1
DERMABOND ADVANCED .7 DNX12 (GAUZE/BANDAGES/DRESSINGS) ×1 IMPLANT
DRSG OPSITE POSTOP 3X4 (GAUZE/BANDAGES/DRESSINGS) ×2 IMPLANT
DURAPREP 26ML APPLICATOR (WOUND CARE) ×2 IMPLANT
ELECT REM PT RETURN 9FT ADLT (ELECTROSURGICAL) ×2
ELECTRODE REM PT RTRN 9FT ADLT (ELECTROSURGICAL) ×1 IMPLANT
GLOVE BIO SURGEON STRL SZ7 (GLOVE) ×2 IMPLANT
GLOVE BIOGEL PI IND STRL 7.0 (GLOVE) ×1 IMPLANT
GLOVE BIOGEL PI IND STRL 7.5 (GLOVE) ×1 IMPLANT
GLOVE BIOGEL PI INDICATOR 7.0 (GLOVE) ×1
GLOVE BIOGEL PI INDICATOR 7.5 (GLOVE) ×1
GOWN STRL REUS W/TWL LRG LVL3 (GOWN DISPOSABLE) ×4 IMPLANT
NEEDLE HYPO 22GX1.5 SAFETY (NEEDLE) ×2 IMPLANT
NS IRRIG 1000ML POUR BTL (IV SOLUTION) ×2 IMPLANT
PACK ABDOMINAL MINOR (CUSTOM PROCEDURE TRAY) ×2 IMPLANT
PENCIL BUTTON HOLSTER BLD 10FT (ELECTRODE) IMPLANT
PROTECTOR NERVE ULNAR (MISCELLANEOUS) ×2 IMPLANT
SPONGE LAP 4X18 RFD (DISPOSABLE) ×2 IMPLANT
SUT MNCRL AB 4-0 PS2 18 (SUTURE) ×2 IMPLANT
SUT PLAIN 2 0 (SUTURE)
SUT PLAIN ABS 2-0 CT1 27XMFL (SUTURE) IMPLANT
SUT VICRYL 0 TIES 12 18 (SUTURE) IMPLANT
SUT VICRYL 0 UR6 27IN ABS (SUTURE) ×2 IMPLANT
SYR CONTROL 10ML LL (SYRINGE) ×2 IMPLANT
TOWEL OR 17X24 6PK STRL BLUE (TOWEL DISPOSABLE) ×4 IMPLANT
TRAY FOLEY CATH SILVER 14FR (SET/KITS/TRAYS/PACK) ×2 IMPLANT

## 2020-11-19 NOTE — Anesthesia Postprocedure Evaluation (Signed)
Anesthesia Post Note  Patient: Taylor Sherman  Procedure(s) Performed: POST PARTUM TUBAL LIGATION (N/A )     Patient location during evaluation: PACU Anesthesia Type: Epidural Level of consciousness: awake and alert Pain management: pain level controlled Vital Signs Assessment: post-procedure vital signs reviewed and stable Respiratory status: spontaneous breathing, nonlabored ventilation and respiratory function stable Cardiovascular status: blood pressure returned to baseline and stable Postop Assessment: no apparent nausea or vomiting, no headache, no backache and epidural receding Anesthetic complications: no   No complications documented.  Last Vitals:  Vitals:   11/19/20 1430 11/19/20 1440  BP: (!) 100/49   Pulse: 66 85  Resp: 11 17  Temp:    SpO2: 100% 100%    Last Pain:  Vitals:   11/19/20 1430  TempSrc:   PainSc: 0-No pain   Pain Goal: Patients Stated Pain Goal: 3 (11/19/20 0830)  LLE Motor Response: Non-purposeful movement (11/19/20 1418)   RLE Motor Response: Non-purposeful movement (11/19/20 1418)          Lucretia Kern

## 2020-11-19 NOTE — Progress Notes (Signed)
CSW attempted to meet with MOB to complete psychosocial assessment; however, MOB was asleep. CSW will attempt to meet with MOB at a later time.   Eliot Popper, LCSW Clinical Social Worker [facility] Cell#: [phone] 

## 2020-11-19 NOTE — Final Progress Note (Addendum)
Pt called out "my placenta is out" Rn went in and there was a big clot in a size of an orange and increased bleeding in her pad.  At 0050.  Fundus was firm but had some trickling of blood. Md notified and orders given and Md came to bedside to evaluate pt.  at 0125 pt passed another clot in the size of plum. Md was notified and new orders were given. Blood loss  calculated by trinton is 803 ml. Will continue to monitor pt's bleeding closely. Labs ordrered for 0500

## 2020-11-19 NOTE — Progress Notes (Addendum)
Called to bedside due to increased bleeding.  Pt up to void and passed large orange size clot- suspect about 50cc.  Pt back in bed.  Denies headache or dizziness.  Resting comfortably in bed.  Objective: Blood pressure (!) 142/95, pulse 82, temperature 98 F (36.7 C), temperature source Oral, resp. rate 18, last menstrual period 03/10/2020, SpO2 100 %, unknown if currently breastfeeding.  Physical Exam:  General: alert, cooperative and no distress Chest: no respiratory distress Heart:regular rate Abdomen: soft, nontender Uterine Fundus: firm with palpation Bimanual exam- no further clots noted, bleeding appropriate DVT Evaluation: No calf swelling or tenderness Extremities: no edema Skin: warm, dry  Assessment/Plan: Taylor Sherman is a 35 y.o. [id number] s/p NSVD at [redacted]w[redacted]d PPD#1 complicated by: 1) cHTN with superimposed preeclampsia Currently on Mag- to be discontinued at 4am today Started on ProcardiaXL 30mg  in am  2) Heme -IM Hemabate x1 and TXA ordered uterus firm, will continue to closely monitor -lochia appropriate, EBL ~800cc (per RN measurement) -labs pending this am  Contraception: desires pospartum tubal, NPO at midnight Feeding: breast/bottle  , DO Attending Obstetrician & Gynecologist, [facility], [facility]

## 2020-11-19 NOTE — Transfer of Care (Signed)
Immediate Anesthesia Transfer of Care Note  Patient: Taylor Sherman  Procedure(s) Performed: POST PARTUM TUBAL LIGATION (N/A )  Patient Location: PACU  Anesthesia Type:Epidural  Level of Consciousness: awake, alert  and oriented  Airway & Oxygen Therapy: Patient Spontanous Breathing  Post-op Assessment: Report given to RN and Post -op Vital signs reviewed and stable  Post vital signs: Reviewed and stable  Last Vitals:  Vitals Value Taken Time  BP 98/44 11/19/20 1342  Temp    Pulse 70 11/19/20 1345  Resp 14 11/19/20 1345  SpO2 98 % 11/19/20 1345  Vitals shown include unvalidated device data.  Last Pain:  Vitals:   11/19/20 1153  TempSrc: Oral  PainSc:       Patients Stated Pain Goal: 3 (11/19/20 0830)  Complications: No complications documented.
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
## 2020-11-19 NOTE — Op Note (Signed)
Operative Note   11/19/2020  PRE-OP DIAGNOSIS: Desire for permanent sterilization.  Postpartum Day #1   POST-OP DIAGNOSIS: Same  SURGEON: Surgeon(s) and Role:    * [place] Bing, MD - Primary  ASSISTANT: Mart Piggs, MD  ANESTHESIA: epidural and local  PROCEDURE: mini-laparotomy, bilateral tubal ligation via via modified [place] method  ESTIMATED BLOOD LOSS: 11mL  DRAINS: per anesthesia note  TOTAL IV FLUIDS: per anesthesia note  SPECIMENS:  Portions of bilateral tubes to pathology  VTE PROPHYLAXIS: SCDs to the bilateral lower extremities  ANTIBIOTICS: not indicated  COMPLICATIONS: none  DISPOSITION: PACU - hemodynamically stable.  CONDITION: stable  FINDINGS: No intra-abdominal adhesions noted, but there were some filmy adhesions on the bilateral tubes to the fimbriae to the ovaries, which is why a salpingectomy was not done; ovaries and tubes looked normal. +lumens noted on each cross section  PROCEDURE IN DETAIL: The patient was taken to the OR where anesthesia was administed. The patient was positioned in dorsal supine. The patient was prepped and draped in the normal sterile fashion a 4cm horizontal incision was made approximately 2 finger breadths below the umbilicus, after injection with local anesthesia. The skin was then incised with the scalpel and the underlying tissue dissected with the bovie and the fascia nicked in the midline with the scalpel and then extended laterally sharply.  The abdomen was then entered bluntly and a moist lap sponge used to displace the bowel.   The left Fallopian tube was identified by tracing out to the fimbraie, grasped with the Babcock clamps. An avascular midsection of the tube approximately 3-4cm from the cornua was grasped with the babcock clamps and the distal and proximal aspects were ligated with a suture of 2-0 vicryl, with the intervening portion of tube was transected and removed, via the Metzenbaum scissors.  Attention  was then turned to the right fallopian tube after confirmation by tracing the tube out to the fimbriae. The same procedure was then performed on the right Fallopian tube, with excellent hemostasis was noted from both BTL sites.   The lap sponge was then removed from the abdomen and the fascia closed in running fashion with 0 vicryl and the subcutaneous tissue irrigated and closed with interrupted sutures of 3-0 plain gut. The skin was then closed with 4-0 monocryl and a dressing.    The patient tolerated the procedure well. All counts were correct x 2. The patient was transferred to the recovery room awake, alert and breathing independently.   Cornelia Copa MD Attending Center for [facility] [facility] Midwife)

## 2020-11-19 NOTE — Anesthesia Preprocedure Evaluation (Signed)
Anesthesia Evaluation  Patient identified by MRN, date of birth, ID band Patient awake    Reviewed: Allergy & Precautions, NPO status , Patient's Chart, lab work & pertinent test results  Airway Mallampati: II  TM Distance: >3 FB Neck ROM: Full    Dental no notable dental hx. (+) Teeth Intact, Dental Advisory Given   Pulmonary asthma , Current SmokerPatient did not abstain from smoking.,    Pulmonary exam normal breath sounds clear to auscultation       Cardiovascular hypertension, Pt. on medications Normal cardiovascular exam Rhythm:Regular Rate:Normal     Neuro/Psych PSYCHIATRIC DISORDERS Anxiety negative neurological ROS     GI/Hepatic negative GI ROS, Neg liver ROS,   Endo/Other  negative endocrine ROS  Renal/GU negative Renal ROS  negative genitourinary   Musculoskeletal negative musculoskeletal ROS (+)   Abdominal   Peds  Hematology negative hematology ROS (+)   Anesthesia Other Findings IOL 2/2 bleeding c/f abruption and severe range BP  Reproductive/Obstetrics (+) Pregnancy                            Anesthesia Physical  Anesthesia Plan  ASA: III  Anesthesia Plan: Epidural   Post-op Pain Management:    Induction:   PONV Risk Score and Plan: Treatment may vary due to age or medical condition and Ondansetron  Airway Management Planned: Natural Airway  Additional Equipment: None  Intra-op Plan:   Post-operative Plan:   Informed Consent: I have reviewed the patients History and Physical, chart, labs and discussed the procedure including the risks, benefits and alternatives for the proposed anesthesia with the patient or authorized representative who has indicated his/her understanding and acceptance.       Plan Discussed with: Anesthesiologist  Anesthesia Plan Comments: (Pt to OR for postpartum tubal. Epidural in place which functioned well for labor. Plan for PPTL  under epidural.)        Anesthesia Quick Evaluation

## 2020-11-19 NOTE — Progress Notes (Signed)
OB Note D/w patient re: BTL specifically risk of failure, regret and no change on periods. Also, standard surgical risks. Pt would like to proceed. BTL papers up to date  Cornelia Copa MD Attending Center for [employer] (Faculty Practice) 11/19/2020 Time: 1216pm

## 2020-11-20 LAB — CULTURE, BETA STREP (GROUP B ONLY)

## 2020-11-20 LAB — SURGICAL PATHOLOGY

## 2020-11-20 MED ORDER — OXYCODONE HCL 5 MG PO TABS
5.0000 mg | ORAL_TABLET | ORAL | Status: DC | PRN
  Administered 2020-11-20 – 2020-11-21 (×3): 5 mg via ORAL
  Filled 2020-11-20 (×3): qty 1

## 2020-11-20 MED ORDER — SIMETHICONE 80 MG PO CHEW
80.0000 mg | CHEWABLE_TABLET | Freq: Four times a day (QID) | ORAL | Status: AC
  Administered 2020-11-20 (×3): 80 mg via ORAL
  Filled 2020-11-20 (×3): qty 1

## 2020-11-20 MED ORDER — NICOTINE 7 MG/24HR TD PT24
7.0000 mg | MEDICATED_PATCH | Freq: Every day | TRANSDERMAL | Status: DC
  Administered 2020-11-20 – 2020-11-21 (×2): 7 mg via TRANSDERMAL
  Filled 2020-11-20 (×2): qty 1

## 2020-11-20 NOTE — Progress Notes (Signed)
OB/GYN Faculty Attending Note  Post Partum Day 2/POD#1  Subjective: Patient is feeling pain at incision site. She reports poorly controlled pain on current PO pain meds. She is ambulating and denies light-headedness or dizziness. She is passing flatus. She is tolerating a regular diet without nausea/vomiting. Bleeding is moderate. She is breast feeding. Baby is in room and doing well.  Objective: Blood pressure 134/90, pulse 67, temperature 98 F (36.7 C), temperature source Oral, resp. rate 18, last menstrual period 03/10/2020, SpO2 100 %, unknown if currently breastfeeding. Temp:  [97.7 F (36.5 C)-98 F (36.7 C)] 98 F (36.7 C) (03/18 0806) Pulse Rate:  [66-95] 67 (03/18 0806) Resp:  [11-31] 18 (03/18 0806) BP: (93-167)/(44-106) 134/90 (03/18 0806) SpO2:  [95 %-100 %] 100 % (03/18 0806)  Physical Exam:  General: alert, oriented, cooperative Chest: normal respiratory effort Heart: RRR  Abdomen: softly but significantlyi distended, tender around incision, appropriately tender to palpation  Uterine Fundus: firm, 2 fingers below the umbilicus Lochia: moderate, rubra DVT Evaluation: no evidence of DVT Extremities: no edema, no calf tenderness  UOP: voiding spontaneously  Recent Labs    11/17/20 2149 11/19/20 0539  HGB 11.2* 9.9*  HCT 32.9* 28.7*    Assessment/Plan: Patient Active Problem List   Diagnosis Date Noted  . Cocaine use complicating pregnancy 11/18/2020  . Marijuana use 11/18/2020  . Tobacco use in pregnancy 11/18/2020  . IUGR (intrauterine growth restriction) affecting care of mother 11/18/2020  . Vaginal delivery 11/18/2020  . Severe preeclampsia 11/18/2020  . Supervision of high risk pregnancy, antepartum 11/17/2020  . [redacted] weeks gestation of pregnancy 10/05/2020  . Hx LEEP (loop electrosurgical excision procedure), cervix, pregnancy, first trimester 06/03/2020  . [redacted] weeks gestation of pregnancy 06/03/2020  . Encounter for supervision of normal pregnancy,  unspecified, first trimester 05/19/2020  . Chronic pre-existing hypertension during pregnancy 02/24/2018  . Cervical intraepithelial neoplasia grade III with severe dysplasia 02/15/2018    Patient is 35 y.o. [id number] PPD#2 s/p SVD at [redacted]w[redacted]d. POD#1 s/p post partum tubal ligation. Course complicated by chronic hypertension with superimposed pre-eclampsia with severe features, s/p 24 hrs magnesium therapy. Blood pressures have been labile requiring multiple changes in meds. She is doing okay, has pretty significant pain in abdomen due to incision but otherwise recovering appropriately. Have added oxycodone for her and will titrate anti-hypertensives today   Cont procardia 60 mg XL Continue routine post partum care Pain meds prn Regular diet BTL for birth control Plan for discharge likely tomorrow   K. Therese Sarah, MD, [facility] Attending Center for [employer] (Faculty Practice)  11/20/2020, 10:57 AM

## 2020-11-20 NOTE — Progress Notes (Signed)
CSW contacted [facility] CPS social worker ([place]) twice to inquire about infant's discharge plan, no answer. CSW left voicemail requesting return phone call.   CSW followed up with MOB at bedside and inquired about how MOB was doing, MOB reported that she was doing good. CSW inquired about MOB's contact with CPS social worker. MOB reported that she spoke with CPS social worker and was unable to recall the name of the Child psychotherapist. MOB reported that the CPS social worker called her yesterday and reported that she would try to visit with MOB today. CSW agreed to follow up with MOB later today to see if CPS social worker follows up with MOB. MOB thanked CSW for visit.   CSW awaiting return call from CPS social worker to inquire about infant's discharge plan.   Currently there are barriers to infant discharging to MOB. Awaiting return call from CPS social worker.   Celso Sickle, LCSW Clinical Social Worker [facility] Cell#: [phone]

## 2020-11-20 NOTE — Progress Notes (Signed)
CSW contacted [place] [facility] CPS social worker (Kenya Herndon) to confirm infant's discharge plan, no answer.   CSW contacted [place] [facility] CPS social work supervisor (Craig Benton) to confirm infant's discharge plan, no answer. CSW left voicemail requesting return phone call.   CSW awaiting return call from [place] [facility] CPS social worker and or CPS social work supervisor to confirm infant's discharge plan.   Daisa Stennis, LCSW Clinical Social Worker [facility] Cell#: [phone]  

## 2020-11-20 NOTE — Progress Notes (Signed)
CSW followed up with MOB at bedside. MOB reported that CPS social worker met with her at bedside and reported no barriers to discharge.   CSW contacted [place] [facility] CPS social worker (Kenya Herndon) to confirm infant's discharge plan, no answer. CSW left voicemail requesting return phone call.   CSW awaiting return call from CPS social worker to confirm infant's discharge plan.   Lenzi Marmo, LCSW Clinical Social Worker [facility] Cell#: [phone] 

## 2020-11-20 NOTE — Lactation Note (Addendum)
This note was copied from a baby's chart. Lactation Consultation Note  Patient Name: Taylor Sherman Today's Date: 11/20/2020   Age:35 hours   [place] Consult with RN:  RN called to inform me that mother inquiring about breast feeding now after her preference on admission has been to formula feed.  This is a LPTI at 36+1 weeks weighing approximately 5 lbs.  Mother's drug screen on admission was + for cocaine. Copied Marcello Moores Hale's recommendation regarding breast feeding with cocaine use and this represents an L5 (hazardous) risk factor.  Met RN and NP in hallway to discuss and NP also recommended no breast feeding at this time.  RN to return to mother's room and provide information and NP's recommendation.  She will call me if mother has any further concern or wants to discuss this matter.     Maternal Data    Feeding    LATCH Score                    Lactation Tools Discussed/Used    Interventions    Discharge    Consult Status      Little Ishikawa 11/20/2020, 2:11 PM

## 2020-11-21 MED ORDER — NIFEDIPINE ER 60 MG PO TB24: 60.0000 mg | ORAL_TABLET | Freq: Every day | ORAL | 1 refills | Status: DC

## 2020-11-21 NOTE — Plan of Care (Signed)
?  Problem: Education: ?Goal: Knowledge of General Education information will improve ?Description: Including pain rating scale, medication(s)/side effects and non-pharmacologic comfort measures ?Outcome: Adequate for Discharge ?  ?Problem: Health Behavior/Discharge Planning: ?Goal: Ability to manage health-related needs will improve ?Outcome: Adequate for Discharge ?  ?Problem: Clinical Measurements: ?Goal: Ability to maintain clinical measurements within normal limits will improve ?Outcome: Adequate for Discharge ?Goal: Will remain free from infection ?Outcome: Adequate for Discharge ?Goal: Diagnostic test results will improve ?Outcome: Adequate for Discharge ?Goal: Respiratory complications will improve ?Outcome: Adequate for Discharge ?Goal: Cardiovascular complication will be avoided ?Outcome: Adequate for Discharge ?  ?Problem: Activity: ?Goal: Risk for activity intolerance will decrease ?Outcome: Adequate for Discharge ?  ?Problem: Nutrition: ?Goal: Adequate nutrition will be maintained ?Outcome: Adequate for Discharge ?  ?Problem: Coping: ?Goal: Level of anxiety will decrease ?Outcome: Adequate for Discharge ?  ?Problem: Elimination: ?Goal: Will not experience complications related to bowel motility ?Outcome: Adequate for Discharge ?Goal: Will not experience complications related to urinary retention ?Outcome: Adequate for Discharge ?  ?Problem: Pain Managment: ?Goal: General experience of comfort will improve ?Outcome: Adequate for Discharge ?  ?Problem: Safety: ?Goal: Ability to remain free from injury will improve ?Outcome: Adequate for Discharge ?  ?Problem: Skin Integrity: ?Goal: Risk for impaired skin integrity will decrease ?Outcome: Adequate for Discharge ?  ?Problem: Education: ?Goal: Knowledge of Childbirth will improve ?Outcome: Adequate for Discharge ?Goal: Ability to make informed decisions regarding treatment and plan of care will improve ?Outcome: Adequate for Discharge ?Goal: Ability to state  and carry out methods to decrease the pain will improve ?Outcome: Adequate for Discharge ?Goal: Individualized Educational Video(s) ?Outcome: Adequate for Discharge ?  ?Problem: Coping: ?Goal: Ability to verbalize concerns and feelings about labor and delivery will improve ?Outcome: Adequate for Discharge ?  ?Problem: Life Cycle: ?Goal: Ability to make normal progression through stages of labor will improve ?Outcome: Adequate for Discharge ?Goal: Ability to effectively push during vaginal delivery will improve ?Outcome: Adequate for Discharge ?  ?Problem: Role Relationship: ?Goal: Will demonstrate positive interactions with the child ?Outcome: Adequate for Discharge ?  ?Problem: Safety: ?Goal: Risk of complications during labor and delivery will decrease ?Outcome: Adequate for Discharge ?  ?Problem: Pain Management: ?Goal: Relief or control of pain from uterine contractions will improve ?Outcome: Adequate for Discharge ?  ?Problem: Education: ?Goal: Knowledge of condition will improve ?Outcome: Adequate for Discharge ?Goal: Individualized Educational Video(s) ?Outcome: Adequate for Discharge ?Goal: Individualized Newborn Educational Video(s) ?Outcome: Adequate for Discharge ?  ?Problem: Activity: ?Goal: Will verbalize the importance of balancing activity with adequate rest periods ?Outcome: Adequate for Discharge ?Goal: Ability to tolerate increased activity will improve ?Outcome: Adequate for Discharge ?  ?Problem: Coping: ?Goal: Ability to identify and utilize available resources and services will improve ?Outcome: Adequate for Discharge ?  ?Problem: Life Cycle: ?Goal: Chance of risk for complications during the postpartum period will decrease ?Outcome: Adequate for Discharge ?  ?Problem: Role Relationship: ?Goal: Ability to demonstrate positive interaction with newborn will improve ?Outcome: Adequate for Discharge ?  ?Problem: Skin Integrity: ?Goal: Demonstration of wound healing without infection will  improve ?Outcome: Adequate for Discharge ?  ?

## 2020-11-24 ENCOUNTER — Encounter: Payer: Medicaid Other | Admitting: Licensed Clinical Social Worker

## 2020-11-25 ENCOUNTER — Ambulatory Visit: Payer: Medicaid Other

## 2020-11-26 ENCOUNTER — Encounter: Payer: Self-pay | Admitting: *Deleted

## 2020-12-01 ENCOUNTER — Ambulatory Visit (INDEPENDENT_AMBULATORY_CARE_PROVIDER_SITE_OTHER): Payer: Medicaid Other | Admitting: Licensed Clinical Social Worker

## 2020-12-01 DIAGNOSIS — Z658 Other specified problems related to psychosocial circumstances: Secondary | ICD-10-CM | POA: Diagnosis not present

## 2020-12-10 NOTE — BH Specialist Note (Signed)
Integrated Behavioral Health via Telemedicine Visit  12/10/2020 Icis Budreau [place] [id number]  Number of Integrated Behavioral Health visits: 1/6 Session Start time: 3:00pm Session End time: 3:18pm Total time: 18 mins via mychart video   Referring Provider: Hosptial discharge  Patient/Family location: Home  [facility] Provider location: [facility]  All persons participating in visit: Pt T. Bouza and LCSWA A. Kenyonna Micek  Types of Service: Individual psychotherapy   I connected with Scotty A Semple and/or Larissa A Teigen's n/a via  Telephone or Video Enabled Telemedicine Application  (Video is Caregility application) and verified that I am speaking with the correct person using two identifiers. Discussed confidentiality: Yes   I discussed the limitations of telemedicine and the availability of in person appointments.  Discussed there is a possibility of technology failure and discussed alternative modes of communication if that failure occurs.  I discussed that engaging in this telemedicine visit, they consent to the provision of behavioral healthcare and the services will be billed under their insurance.  Patient and/or legal guardian expressed understanding and consented to Telemedicine visit: Yes   Presenting Concerns: Patient and/or family reports the following symptoms/concerns: psychosocial stress  Duration of problem: over one year ; Severity of problem: mild  Patient and/or Family's Strengths/Protective Factors: Sense of purpose  Goals Addressed: Patient will: 1.  Reduce symptoms of: stress  2.  Increase knowledge and/or ability of: stress reduction  3.  Demonstrate ability to: Increase adequate support systems for patient/family  Progress towards Goals: Ongoing  Interventions: Interventions utilized:  Supportive Counseling Standardized Assessments completed: n/a     Assessment: Patient currently experiencing psychosocial stressor   Patient may benefit from community wraparound  services   Plan: 1. Follow up with behavioral health clinician on : as needed  2. Behavioral recommendations: contact [place] housing authority, and affordable housing info provided  3. Referral(s): [employer]:  Housing  I discussed the assessment and treatment plan with the patient and/or parent/guardian. They were provided an opportunity to ask questions and all were answered. They agreed with the plan and demonstrated an understanding of the instructions.   They were advised to call back or seek an in-person evaluation if the symptoms worsen or if the condition fails to improve as anticipated.  Gwyndolyn Saxon, LCSW

## 2020-12-30 ENCOUNTER — Ambulatory Visit: Payer: Medicaid Other | Admitting: Obstetrics and Gynecology

## 2021-01-19 ENCOUNTER — Encounter: Payer: Self-pay | Admitting: Obstetrics and Gynecology

## 2021-01-19 ENCOUNTER — Other Ambulatory Visit: Payer: Self-pay

## 2021-01-19 ENCOUNTER — Ambulatory Visit (INDEPENDENT_AMBULATORY_CARE_PROVIDER_SITE_OTHER): Payer: Medicaid Other | Admitting: Obstetrics and Gynecology

## 2021-01-19 DIAGNOSIS — I10 Essential (primary) hypertension: Secondary | ICD-10-CM

## 2021-01-19 NOTE — Progress Notes (Signed)
Post Partum Visit Note  Taylor Sherman is a 35 y.o. [id number] female who presents for a postpartum visit. She is 8 weeks postpartum following a normal spontaneous vaginal delivery.  I have fully reviewed the prenatal and intrapartum course. The delivery was at [redacted]w[redacted]d gestational weeks.  Anesthesia: epidural. Postpartum course has been complicated due to personal stressors. Baby is doing well. Baby is feeding by both breast and bottle - Gerber GoodStart Gentle. Bleeding no bleeding. Bowel function is normal. Bladder function is normal. Patient is sexually active. Contraception method is tubal ligation. Postpartum depression screening: positive. EPDS 10   The pregnancy intention screening data noted above was reviewed. Potential methods of contraception were discussed. The patient elected to proceed with Female Sterilization.    Edinburgh Postnatal Depression Scale - 01/19/21 1100      Edinburgh Postnatal Depression Scale:  In the Past 7 Days   I have been able to laugh and see the funny side of things. 0    I have looked forward with enjoyment to things. 1    I have blamed myself unnecessarily when things went wrong. 1    I have been anxious or worried for no good reason. 0    I have felt scared or panicky for no good reason. 2    Things have been getting on top of me. 1    I have been so unhappy that I have had difficulty sleeping. 1    I have felt sad or miserable. 2    I have been so unhappy that I have been crying. 2    The thought of harming myself has occurred to me. 0    Edinburgh Postnatal Depression Scale Total 10            The following portions of the patient's history were reviewed and updated as appropriate: allergies, current medications, past family history, past medical history, past social history, past surgical history and problem list.  Review of Systems Pertinent items are noted in HPI.    Objective:  BP (!) 174/116   Pulse 91   Ht 5\' 2"  (1.575 m)   Wt 124  lb (56.2 kg)   LMP 01/13/2021 (Exact Date)   Breastfeeding Yes   BMI 22.68 kg/m    General:  alert, cooperative and no distress   Breasts:  deferred  Lungs: clear to auscultation bilaterally  Heart:  regular rate and rhythm  Abdomen: soft, non-tender; bowel sounds normal; no masses,  no organomegaly and infraumbilical tubal scar well healed   Vulva:  not evaluated  Vagina: not evaluated  Cervix:  deferred  Corpus: not examined  Adnexa:  not evaluated  Rectal Exam: Not performed.        Assessment:    abnormal postpartum exam primarily due to blood pressure  Multiple severe range pressure.  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  She is not currently compliant with her oral medications. Pap smear not done at today's visit.   Plan:   Essential components of care per ACOG recommendations:  1.  Mood and well being: Patient with positive depression screening today. Reviewed local resources for support. Pt to see behavorial specialist in office today. - Patient does use tobacco.  If using tobacco we discussed reduction and for recently cessation risk of relapse - hx of drug use? Yes   If yes, discussed support systems  2. Infant care and feeding:  -Patient currently breastmilk feeding? Yes  -Social determinants of  health (SDOH) reviewed in EPIC. The following needs were identified: smoking and possible return of drug use, mood disorder  3. Sexuality, contraception and birth spacing - Patient does not want a pregnancy in the next year.  Desired family size is completed.  - Reviewed forms of contraception in tiered fashion. Patient desired bilateral tubal ligation today.  Already done as postpartum.  4. Sleep and fatigue -Encouraged family/partner/community support of 4 hrs of uninterrupted sleep to help with mood and fatigue  5. Physical Recovery  - Discussed patients delivery and complications - Patient had no lacerations, perineal healing reviewed. Patient expressed  understanding - Patient has urinary incontinence? No - Patient is safe to resume physical and sexual activity  6.  Health Maintenance - Last pap smear done 2021 and was ASCUS with negative HPV.  7. Hypertension: unsure if due to noncompliance or resumption of cocaine use.  Pt is outside window for MAU eval, pt advised to go directly to main ER for BP control and further eval.  UDS in office today. BP check in 1 week. - PCP follow up: will refer to [facility] practice clinic for long term care.  Warden Fillers, MD [facility] for [employer], [facility]

## 2021-01-27 ENCOUNTER — Ambulatory Visit: Payer: Medicaid Other

## 2021-03-10 IMAGING — US US MFM OB DETAIL+14 WK
1 series · 13 of 28 positions shown · non-contrast
Comparison: none

[Series 1: us mfm ob detail+14 wk · 100 acquisitions, 13 frames shown]
[im 4/100]
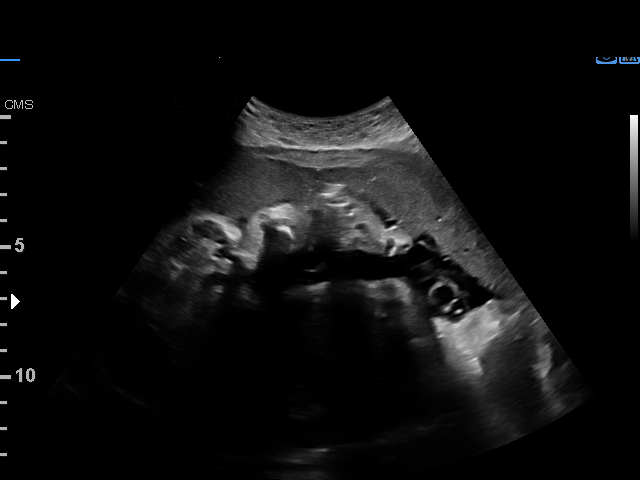
[im 12/100]
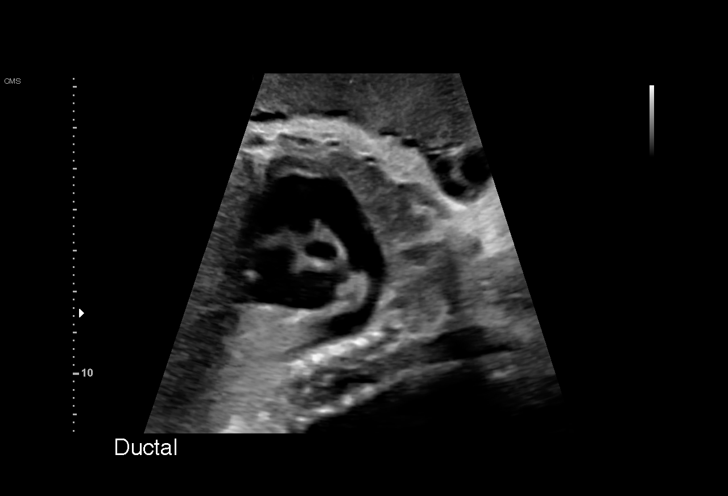
[im 19/100]
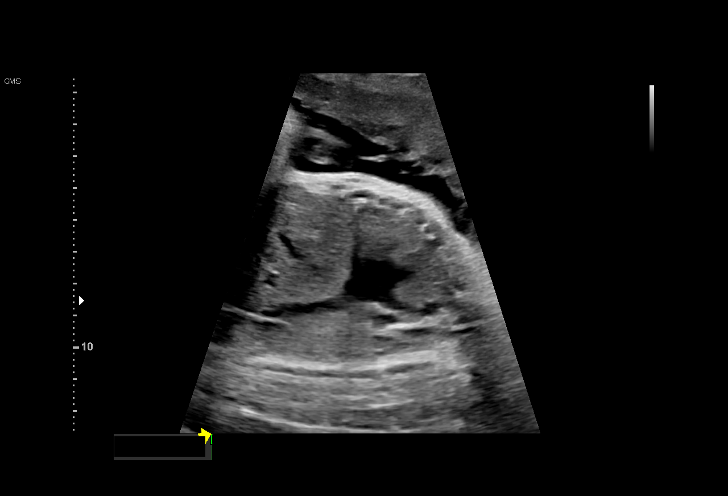
[im 26/100]
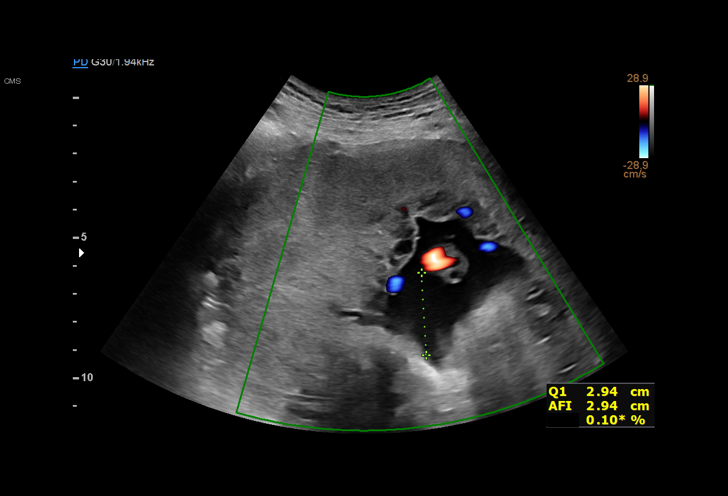
[im 34/100]
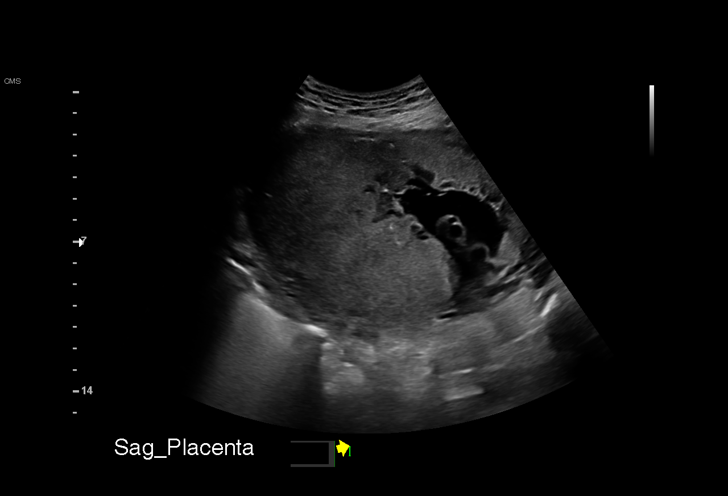
[im 41/100]
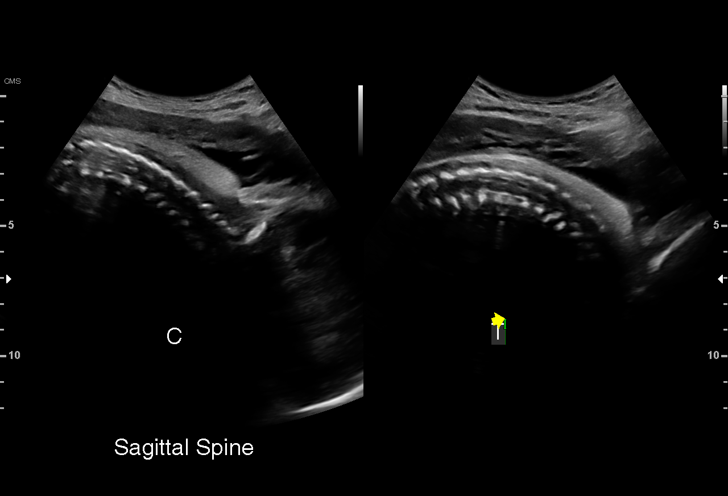
[im 52/100]
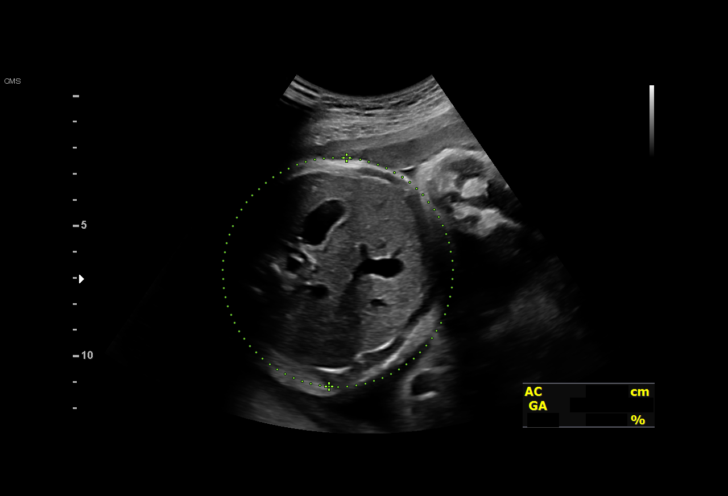
[im 59/100]
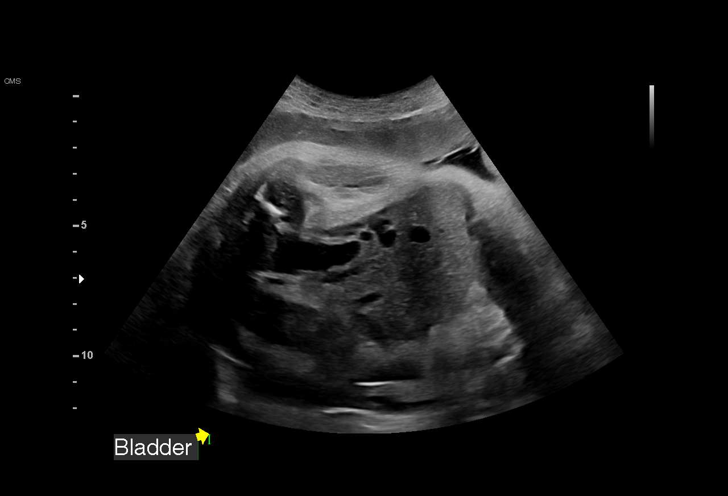
[im 67/100]
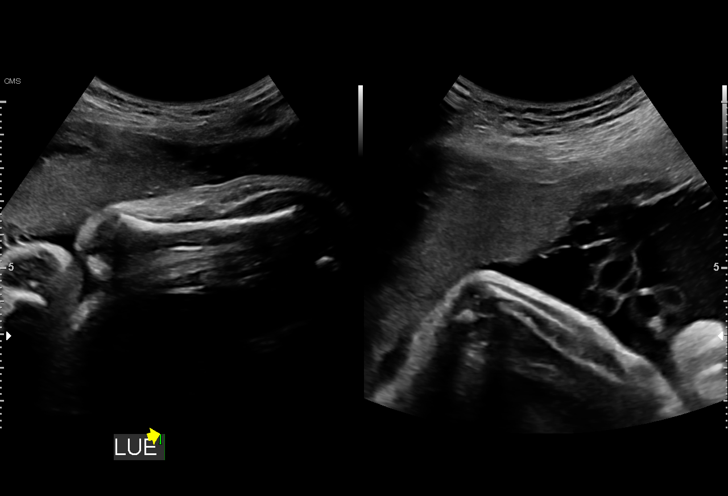
[im 74/100]
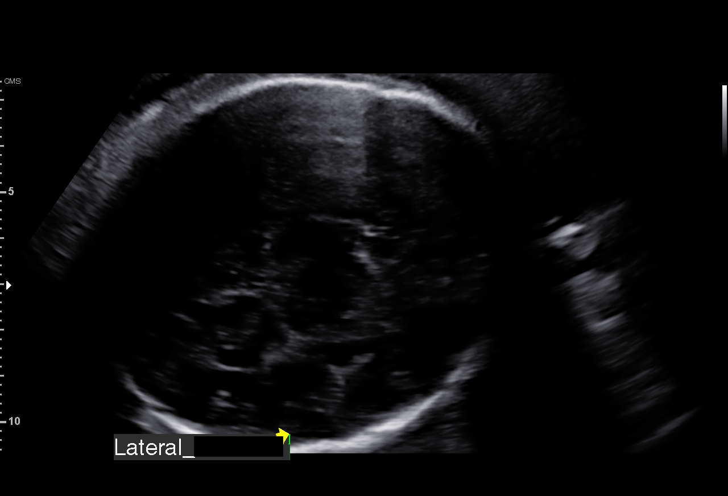
[im 81/100]
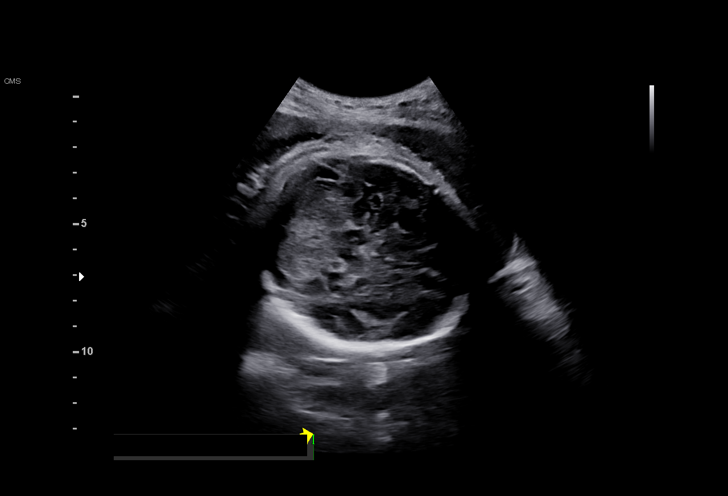
[im 89/100]
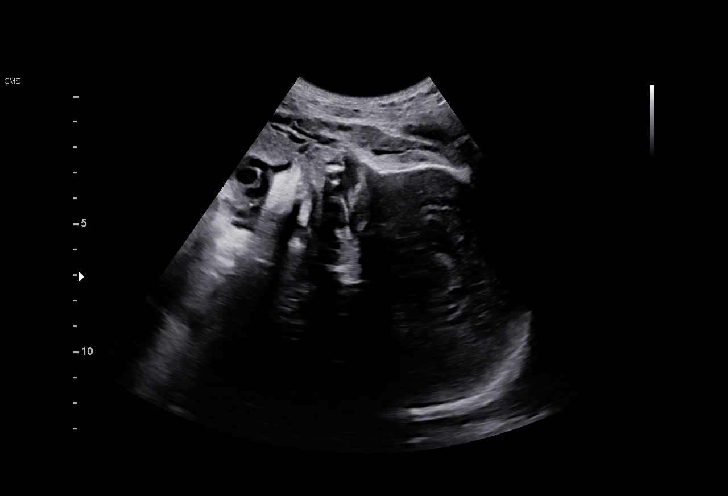
[im 96/100]
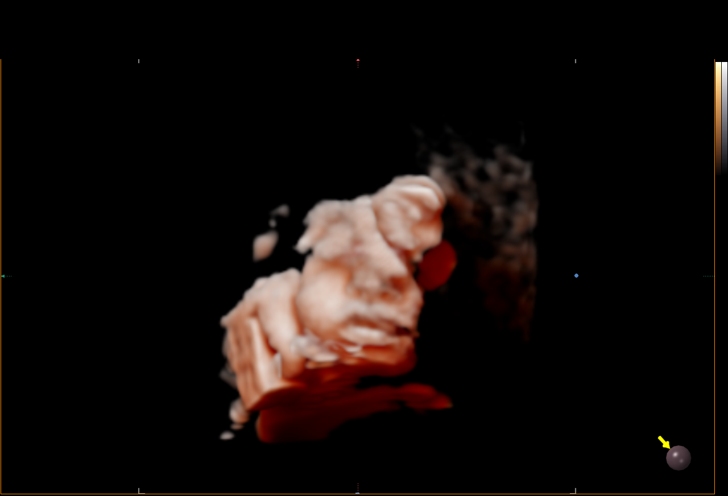

[13 of 28 positions shown; findings below may reference images not displayed]

[REDACTED]care

Indications

 33 weeks gestation of pregnancy
 Antenatal screening for malformations
 Hypertension - Chronic/Pre-existing
 (Procardia)
 Genetic carrier (Juozas Radvilaite)
 Poor obstetric history: Previous preterm
 delivery, antepartum (29+6 weeks)
 Previous cervical surgery (LEEP)
 Low risk NIPS
 Maternal care for known or suspected poor
 fetal growth, third trimester, not applicable or
 unspecified IUGR
Fetal Evaluation

 Num Of Fetuses:         1
 Fetal Heart Rate(bpm):  137
 Cardiac Activity:       Observed
 Presentation:           Cephalic
 Placenta:               Anterior Left
 P. Cord Insertion:      Visualized

 Amniotic Fluid
 AFI FV:      Within normal limits

 AFI Sum(cm)     %Tile       Largest Pocket(cm)
 12.1            34
 RUQ(cm)       RLQ(cm)       LUQ(cm)        LLQ(cm)

Biometry

 BPD:      78.8  mm     G. Age:  31w 4d          9  %    CI:        72.87   %    70 - 86
                                                         FL/HC:      21.1   %    19.9 -
 HC:      293.5  mm     G. Age:  32w 3d          6  %    HC/AC:      1.08        0.96 -
 AC:      270.7  mm     G. Age:  31w 1d          7  %    FL/BPD:     78.4   %    71 - 87
 FL:       61.8  mm     G. Age:  32w 0d         14  %    FL/AC:      22.8   %    20 - 24
 HUM:      51.7  mm     G. Age:  30w 1d        < 5  %
 CER:      42.4  mm     G. Age:  33w 4d         37  %

 LV:        4.1  mm

 Est. FW:    0295  gm    3 lb 15 oz       8  %
OB History

 Gravidity:    6         Term:   2        Prem:   1        SAB:   2
 TOP:          0       Ectopic:  0        Living: 3
Gestational Age

 LMP:           33w 1d        Date:  03/10/20                 EDD:   12/15/20
 U/S Today:     31w 6d                                        EDD:   12/24/20
 Best:          33w 1d     Det. By:  LMP  (03/10/20)          EDD:   12/15/20
Anatomy

 Cranium:               Appears normal         LVOT:                   Appears normal
 Cavum:                 Appears normal         Aortic Arch:            Appears normal
 Ventricles:            Appears normal         Ductal Arch:            Appears normal
 Choroid Plexus:        Not well visualized    Diaphragm:              Appears normal
 Cerebellum:            Appears normal         Stomach:                Appears normal, left
                                                                       sided
 Posterior Fossa:       Appears normal         Abdomen:                Appears normal
 Nuchal Fold:           Not applicable (>20    Abdominal Wall:         Not well visualized
                        wks GA)
 Face:                  Appears normal         Cord Vessels:           Appears normal (3
                        (orbits and profile)                           vessel cord)
 Lips:                  Appears normal         Kidneys:                Appear normal
 Palate:                Not well visualized    Bladder:                Appears normal
 Thoracic:              Appears normal         Spine:                  Not well visualized
 Heart:                 Appears normal         Upper Extremities:      Visualized
                        (4CH, axis, and
                        situs)
 RVOT:                  Appears normal         Lower Extremities:      Visualized

 Other:  Fetus appears to be female. Nasal bone visualized. Lenses
         visualized. Technically difficult due to advanced gestational age.
Doppler - Fetal Vessels

 Umbilical Artery
  S/D     %tile      RI    %tile                             ADFV    RDFV
   4.2   > 97.5    0.76       97                                No      No
Impression

 G6 P3.  Patient is here for fetal anatomy scan.  On cell free
 fetal DNA screening, the risks of fetal aneuploidies are not
 increased.  She has chronic hypertension and takes
 Procardia.  Patient reports she has been irregular in taking
 her medications.  Blood pressure today at her office is 146/93
 mmHg.
 Her pregnancy is well dated by LMP date that was consistent
 with 9-week ultrasound.

 On today's ultrasound, the estimated fetal weight is at the 8
 percentile.  Abdominal circumference measurement is at the
 7th percentile.  Amniotic fluid is normal and good fetal activity
 seen.  Antenatal testing is reassuring.  Umbilical artery
 Doppler showed increased S/D ratio.  Fetal anatomical
 survey appears normal but limited by advanced gestational
 age.

 I explained the finding of fetal growth restriction and abnormal
 Doppler studies.  I emphasized the importance of frequent
 antenatal testing to identify fetal compromise.  I also
 encouraged her to take her blood pressure medications
 regularly to prevent complications of chronic hypertension.
Recommendations

 -Weekly BPP, NST and UA Doppler studies till delivery.
 -Fetal growth assessment in 3 weeks.
                 Ganuza, Gedber

## 2022-01-22 ENCOUNTER — Encounter (HOSPITAL_COMMUNITY): Payer: Self-pay | Admitting: Emergency Medicine

## 2022-01-22 ENCOUNTER — Emergency Department (HOSPITAL_COMMUNITY)
Admission: EM | Admit: 2022-01-22 | Discharge: 2022-01-22 | Disposition: A | Discharge status: Home / Self Care | Payer: Medicaid Other | Attending: Emergency Medicine | Admitting: Emergency Medicine

## 2022-01-22 ENCOUNTER — Other Ambulatory Visit: Payer: Self-pay

## 2022-01-22 ENCOUNTER — Emergency Department (HOSPITAL_COMMUNITY): Payer: Medicaid Other

## 2022-01-22 DIAGNOSIS — M25562 Pain in left knee: Secondary | ICD-10-CM | POA: Insufficient documentation

## 2022-01-22 DIAGNOSIS — R7309 Other abnormal glucose: Secondary | ICD-10-CM | POA: Insufficient documentation

## 2022-01-22 DIAGNOSIS — M1380 Other specified arthritis, unspecified site: Secondary | ICD-10-CM

## 2022-01-22 DIAGNOSIS — M1990 Unspecified osteoarthritis, unspecified site: Secondary | ICD-10-CM

## 2022-01-22 LAB — SYNOVIAL CELL COUNT + DIFF, W/ CRYSTALS
Crystals, Fluid: NONE SEEN
Eosinophils-Synovial: 0 % (ref 0–1)
Lymphocytes-Synovial Fld: 3 % (ref 0–20)
Monocyte-Macrophage-Synovial Fluid: 8 % — ABNORMAL LOW (ref 50–90)
Neutrophil, Synovial: 89 % — ABNORMAL HIGH (ref 0–25)
WBC, Synovial: 4200 /mm3 — ABNORMAL HIGH (ref 0–200)

## 2022-01-22 LAB — CBC WITH DIFFERENTIAL/PLATELET
Abs Immature Granulocytes: 0.02 10*3/uL (ref 0.00–0.07)
Basophils Absolute: 0 10*3/uL (ref 0.0–0.1)
Basophils Relative: 0 %
Eosinophils Absolute: 0.1 10*3/uL (ref 0.0–0.5)
Eosinophils Relative: 1 %
HCT: 42.7 % (ref 36.0–46.0)
Hemoglobin: 13.8 g/dL (ref 12.0–15.0)
Immature Granulocytes: 0 %
Lymphocytes Relative: 19 %
Lymphs Abs: 1.5 10*3/uL (ref 0.7–4.0)
MCH: 29.2 pg (ref 26.0–34.0)
MCHC: 32.3 g/dL (ref 30.0–36.0)
MCV: 90.3 fL (ref 80.0–100.0)
Monocytes Absolute: 0.6 10*3/uL (ref 0.1–1.0)
Monocytes Relative: 8 %
Neutro Abs: 5.7 10*3/uL (ref 1.7–7.7)
Neutrophils Relative %: 72 %
Platelets: 275 10*3/uL (ref 150–400)
RBC: 4.73 MIL/uL (ref 3.87–5.11)
RDW: 13.6 % (ref 11.5–15.5)
WBC: 7.9 10*3/uL (ref 4.0–10.5)
nRBC: 0 % (ref 0.0–0.2)

## 2022-01-22 LAB — BASIC METABOLIC PANEL
Anion gap: 6 (ref 5–15)
BUN: 7 mg/dL (ref 6–20)
CO2: 22 mmol/L (ref 22–32)
Calcium: 9.3 mg/dL (ref 8.9–10.3)
Chloride: 109 mmol/L (ref 98–111)
Creatinine, Ser: 0.86 mg/dL (ref 0.44–1.00)
GFR, Estimated: >60 mL/min (ref >60)
Glucose, Bld: 99 mg/dL (ref 70–99)
Potassium: 3.7 mmol/L (ref 3.5–5.1)
Sodium: 137 mmol/L (ref 135–145)

## 2022-01-22 LAB — URINALYSIS, ROUTINE W REFLEX MICROSCOPIC
Bilirubin Urine: NEGATIVE
Glucose, UA: NEGATIVE mg/dL
Hgb urine dipstick: NEGATIVE
Ketones, ur: NEGATIVE mg/dL
Leukocytes,Ua: NEGATIVE
Nitrite: NEGATIVE
Protein, ur: NEGATIVE mg/dL
Specific Gravity, Urine: 1.016 (ref 1.005–1.030)
pH: 6 (ref 5.0–8.0)

## 2022-01-22 LAB — I-STAT BETA HCG BLOOD, ED (MC, WL, AP ONLY): I-stat hCG, quantitative: <5 m[IU]/mL (ref <5)

## 2022-01-22 LAB — CBG MONITORING, ED: Glucose-Capillary: 113 mg/dL — ABNORMAL HIGH (ref 70–99)

## 2022-01-22 MED ORDER — ONDANSETRON HCL 4 MG/2ML IJ SOLN
4.0000 mg | Freq: Once | INTRAMUSCULAR | Status: AC
  Administered 2022-01-22: 4 mg via INTRAVENOUS
  Filled 2022-01-22: qty 2

## 2022-01-22 MED ORDER — HYDROMORPHONE HCL 1 MG/ML IJ SOLN
0.5000 mg | Freq: Once | INTRAMUSCULAR | Status: AC
  Administered 2022-01-22: 0.5 mg via INTRAVENOUS
  Filled 2022-01-22: qty 1

## 2022-01-22 MED ORDER — LORAZEPAM 2 MG/ML IJ SOLN
0.5000 mg | Freq: Once | INTRAMUSCULAR | Status: AC
  Administered 2022-01-22: 0.5 mg via INTRAVENOUS
  Filled 2022-01-22: qty 1

## 2022-01-22 MED ORDER — OXYCODONE HCL 5 MG PO TABS
10.0000 mg | ORAL_TABLET | Freq: Once | ORAL | Status: AC
  Administered 2022-01-22: 10 mg via ORAL
  Filled 2022-01-22: qty 2

## 2022-01-22 MED ORDER — SODIUM CHLORIDE 0.9 % IV BOLUS
1000.0000 mL | Freq: Once | INTRAVENOUS | Status: AC
  Administered 2022-01-22: 1000 mL via INTRAVENOUS

## 2022-01-22 MED ORDER — KETOROLAC TROMETHAMINE 15 MG/ML IJ SOLN
15.0000 mg | Freq: Once | INTRAMUSCULAR | Status: AC
  Administered 2022-01-22: 15 mg via INTRAVENOUS
  Filled 2022-01-22: qty 1

## 2022-01-22 MED ORDER — OXYCODONE HCL 5 MG PO TABS: 5.0000 mg | ORAL_TABLET | ORAL | 0 refills | Status: AC | PRN

## 2022-01-22 MED ORDER — LIDOCAINE HCL (PF) 1 % IJ SOLN
30.0000 mL | Freq: Once | INTRAMUSCULAR | Status: DC
  Filled 2022-01-22: qty 30

## 2022-01-22 NOTE — ED Provider Notes (Signed)
[facility] EMERGENCY DEPARTMENT Provider Note   CSN: [id number] Arrival date & time: 01/22/22  1231     History  Chief Complaint  Patient presents with   Knee Pain    Taylor Sherman is a 36 y.o. female who presents emergency department with a chief complaint of knee pain.  Patient states that she had a little bit of pain in the left anterolateral knee when she was going to bed very early this morning around 12:30 AM.  Patient states she awoke from sleep with severe left knee pain.  She states that she could barely move it.  She did not injure it.  She is never had any pain like this before.  She denies IV drug use.  She denies fever or chills.  She has no previous history of gout or pseudogout.   Knee Pain     Home Medications Prior to Admission medications   Medication Sig Start Date End Date Taking? Authorizing Provider  Blood Pressure Monitoring (BLOOD PRESSURE KIT) DEVI 1 kit by Does not apply route once a week. 05/19/20   Cephas Darby, MD  NIFEdipine (ADALAT CC) 60 MG 24 hr tablet Take 1 tablet (60 mg total) by mouth daily. 11/21/20   Donnamae Jude, MD  Prenatal Vit-Fe Phos-FA-Omega (VITAFOL GUMMIES) 3.33-0.333-34.8 MG CHEW Chew 3 tablets by mouth daily. 05/19/20   Cephas Darby, MD      Allergies    Patient has no known allergies.    Review of Systems   Review of Systems  Physical Exam Updated Vital Signs BP (!) 168/118 (BP Location: Left Arm)   Pulse 75   Temp 98 F (36.7 C) (Oral)   Resp 18   LMP 01/03/2022   SpO2 100%  Physical Exam Vitals and nursing note reviewed.  Constitutional:      General: She is not in acute distress.    Appearance: She is well-developed. She is not diaphoretic.  HENT:     Head: Normocephalic and atraumatic.     Right Ear: External ear normal.     Left Ear: External ear normal.     Nose: Nose normal.     Mouth/Throat:     Mouth: Mucous membranes are moist.  Eyes:     General: No scleral icterus.     Conjunctiva/sclera: Conjunctivae normal.  Cardiovascular:     Rate and Rhythm: Normal rate and regular rhythm.     Heart sounds: Normal heart sounds. No murmur heard.   No friction rub. No gallop.  Pulmonary:     Effort: Pulmonary effort is normal. No respiratory distress.     Breath sounds: Normal breath sounds.  Abdominal:     General: Bowel sounds are normal. There is no distension.     Palpations: Abdomen is soft. There is no mass.     Tenderness: There is no abdominal tenderness. There is no guarding.  Musculoskeletal:     Cervical back: Normal range of motion.     Comments: Left knee with severe pain with any movement.  There is obvious effusion.  Exquisite tenderness to palpation.  No obvious surrounding erythema.  Skin:    General: Skin is warm and dry.  Neurological:     Mental Status: She is alert and oriented to person, place, and time.  Psychiatric:        Behavior: Behavior normal.    ED Results / Procedures / Treatments   Labs (all labs ordered are listed, but only  abnormal results are displayed) Labs Reviewed  BODY FLUID CULTURE W GRAM STAIN  GRAM STAIN  CBC WITH DIFFERENTIAL/PLATELET  BASIC METABOLIC PANEL  URINALYSIS, ROUTINE W REFLEX MICROSCOPIC  SYNOVIAL CELL COUNT + DIFF, W/ CRYSTALS  GLUCOSE, BODY FLUID OTHER            I-STAT BETA HCG BLOOD, ED ([facility], [facility], [facility] ONLY)  CBG MONITORING, ED    EKG None  Radiology No results found.  Procedures .Joint Aspiration/Arthrocentesis  Date/Time: 01/22/2022 2:28 PM Performed by: Margarita Mail, PA-C Authorized by: Margarita Mail, PA-C   Consent:    Consent obtained:  Verbal   Consent given by:  Patient   Risks discussed:  Bleeding, incomplete drainage, nerve damage, infection and pain   Alternatives discussed:  No treatment Universal protocol:    Procedure explained and questions answered to patient or proxy's satisfaction: yes     Test results available: yes     Imaging studies available: yes      Site/side marked: yes     Immediately prior to procedure, a time out was called: yes     Patient identity confirmed:  Verbally with patient Location:    Location:  Knee   Knee:  L knee Anesthesia:    Anesthesia method:  Local infiltration   Local anesthetic:  Lidocaine 1% w/o epi Procedure details:    Preparation: Patient was prepped and draped in usual sterile fashion     Needle gauge:  18 G   Ultrasound guidance: no     Approach:  Superior   Aspirate amount:  40 ml   Aspirate characteristics:  Blood-tinged and cloudy   Steroid injected: no     Specimen collected: yes   Post-procedure details:    Dressing:  Adhesive bandage   Procedure completion:  Tolerated well, no immediate complications    Medications Ordered in ED Medications  sodium chloride 0.9 % bolus 1,000 mL (has no administration in time range)  HYDROmorphone (DILAUDID) injection 0.5 mg (has no administration in time range)  ondansetron (ZOFRAN) injection 4 mg (has no administration in time range)  lidocaine (PF) (XYLOCAINE) 1 % injection 30 mL (has no administration in time range)    ED Course/ Medical Decision Making/ A&P Clinical Course as of 01/22/22 1501  Sat Jan 22, 2022  1501 DG Knee Complete 4 Views Left I visualized and interpreted 4 view knee x-ray.  There is an effusion present. [AH]  1501 CBC with Differential [AH]  1501 I-Stat beta hCG blood, ED [AH]  4854 Basic metabolic panel [AH]  6270 CBG monitoring, ED(!) Labs without significant finding.  I reviewed and interpreted these labs [AH]    Clinical Course User Index [AH] Margarita Mail, PA-C                           Medical Decision Making 36 year old female who presents the emergency department with atraumatic acute severe left knee pain and effusion.  Differential diagnosis includes crystal apathy, AVN, traumatic hemarthrosis less likely due to lack of trauma, osteoarthritis, bacterial infection, osteomyelitis. Patient is not  immunocompromise and I have low suspicion for septic joint after aspiration.  There is no frank purulence.  Question gout, pseudogout? Patient's pain is significantly improved after arthrocentesis with aspiration and pain meds.  She is notably hypertensive.  She takes antihypertensive medications.   Patient will need follow up on her labs which are pending. S/o given to Dr. Royce Macadamia at shift  change.  Amount and/or Complexity of Data Reviewed Labs: ordered. Radiology: ordered.  Risk Prescription drug management.           Final Clinical Impression(s) / ED Diagnoses Final diagnoses:  None    Rx / DC Orders ED Discharge Orders     None         Margarita Mail, PA-C 01/22/22 1509    Tegeler, Gwenyth Allegra, MD 01/22/22 [phone]

## 2022-01-22 NOTE — ED Triage Notes (Signed)
C/o L knee pain and swelling since last night.  Denies injury.

## 2022-01-22 NOTE — ED Provider Notes (Signed)
  Physical Exam  BP (!) 196/121   Pulse 66   Temp 98 F (36.7 C) (Oral)   Resp 20   LMP 01/03/2022   SpO2 99%   Physical Exam  Procedures  Procedures  ED Course / MDM   Clinical Course as of 01/22/22 1856  Sat Jan 22, 2022  1501 DG Knee Complete 4 Views Left I visualized and interpreted 4 view knee x-ray.  There is an effusion present. [AH]  1501 CBC with Differential [AH]  1501 I-Stat beta hCG blood, ED [AH]  1501 Basic metabolic panel [AH]  1501 CBG monitoring, ED(!) Labs without significant finding.  I reviewed and interpreted these labs [AH]    Clinical Course User Index [AH] Arthor Captain, PA-C   Medical Decision Making Amount and/or Complexity of Data Reviewed Labs: ordered. Decision-making details documented in ED Course. Radiology: ordered. Decision-making details documented in ED Course.  Risk Prescription drug management.   36 year old female with no pertinent past medical history presenting to the ED with acute pain in the left knee with associated effusion.  40 cc of fluid was able to be extracted from the knee joint and body fluid analysis are pending.  No leukocytosis on her CBC.  Plan to follow-up body fluid labs.  Patient continues to endorse severe pain so she was given 10 mg of p.o. oxycodone.  Cell count showed 4200 WBCs with 89% neutrophils.  Gram stain showed white blood cells but no organisms.  This is both consistent with an inflammatory arthritis.  No concern for septic arthritis at this time.  Patient given IV Toradol in the ED and instructed to continue Tylenol Motrin at home.  She was also given a prescription for oxycodone x3 days for breakthrough pain.  She was also given crutches for comfort. I have also recommended that she follow-up with her PCP within the next week.  Strict return precautions were discussed and the patient was discharged home in stable condition.       Laurence Compton, MD 01/22/22 Vinnie Langton    Pricilla Loveless,  MD 01/27/22 [phone]

## 2022-01-22 NOTE — ED Notes (Signed)
Pt has 2+ swelling to left knee. Pt has 2+ left pedal pulse, cap refill less than 3 sec, pt able to wiggle toes. Pt denies numbness and tingling.

## 2022-01-22 NOTE — Discharge Instructions (Addendum)
You are found to have an inflammatory reaction in your knee today, but there were no signs of infection in your work-up. Alternate Tylenol and Motrin every 3-4 hours as needed for pain. You could also take oxycodone, 1 pill every 4-6 hours as needed for severe pain. Please return to the emergency department if you develop fevers, worsening pain, or warmth/swelling over the knee.

## 2022-01-24 LAB — GLUCOSE, BODY FLUID OTHER: Glucose, Body Fluid Other: 85 mg/dL

## 2022-01-25 LAB — BODY FLUID CULTURE W GRAM STAIN
Culture: NO GROWTH
Special Requests: NORMAL

## 2022-05-23 ENCOUNTER — Emergency Department (HOSPITAL_COMMUNITY): Payer: Medicaid Other

## 2022-05-23 ENCOUNTER — Encounter (HOSPITAL_COMMUNITY): Payer: Self-pay

## 2022-05-23 ENCOUNTER — Emergency Department (HOSPITAL_COMMUNITY)
Admission: EM | Admit: 2022-05-23 | Discharge: 2022-05-23 | Disposition: A | Discharge status: Home / Self Care | Payer: Medicaid Other | Attending: Emergency Medicine | Admitting: Emergency Medicine

## 2022-05-23 ENCOUNTER — Other Ambulatory Visit: Payer: Self-pay

## 2022-05-23 DIAGNOSIS — R0781 Pleurodynia: Secondary | ICD-10-CM | POA: Insufficient documentation

## 2022-05-23 DIAGNOSIS — I10 Essential (primary) hypertension: Secondary | ICD-10-CM | POA: Diagnosis not present

## 2022-05-23 DIAGNOSIS — M545 Low back pain, unspecified: Secondary | ICD-10-CM | POA: Diagnosis present

## 2022-05-23 DIAGNOSIS — J45909 Unspecified asthma, uncomplicated: Secondary | ICD-10-CM | POA: Insufficient documentation

## 2022-05-23 DIAGNOSIS — Z79899 Other long term (current) drug therapy: Secondary | ICD-10-CM | POA: Diagnosis not present

## 2022-05-23 DIAGNOSIS — W01198A Fall on same level from slipping, tripping and stumbling with subsequent striking against other object, initial encounter: Secondary | ICD-10-CM | POA: Diagnosis not present

## 2022-05-23 MED ORDER — ACETAMINOPHEN 500 MG PO TABS
1000.0000 mg | ORAL_TABLET | Freq: Once | ORAL | Status: AC
  Administered 2022-05-23: 1000 mg via ORAL
  Filled 2022-05-23: qty 2
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
## 2022-05-23 NOTE — Discharge Instructions (Addendum)
You were seen today for back pain and rib pain. Your imaging was reassuring for no signs of fracture or dislocation. Please take Tylenol or ibuprofen as needed for pain. Your pain should improve with rest.

## 2022-05-23 NOTE — ED Triage Notes (Signed)
Patient reports back pain after slipping getting patient out of shower.

## 2022-10-21 ENCOUNTER — Encounter (HOSPITAL_COMMUNITY): Payer: Self-pay

## 2022-10-21 ENCOUNTER — Other Ambulatory Visit: Payer: Self-pay

## 2022-10-21 ENCOUNTER — Emergency Department (HOSPITAL_COMMUNITY)
Admission: EM | Admit: 2022-10-21 | Discharge: 2022-10-21 | Disposition: A | Discharge status: Home / Self Care | Payer: Medicaid Other | Attending: Emergency Medicine | Admitting: Emergency Medicine

## 2022-10-21 DIAGNOSIS — Z20822 Contact with and (suspected) exposure to covid-19: Secondary | ICD-10-CM | POA: Diagnosis not present

## 2022-10-21 DIAGNOSIS — J45909 Unspecified asthma, uncomplicated: Secondary | ICD-10-CM | POA: Insufficient documentation

## 2022-10-21 DIAGNOSIS — J101 Influenza due to other identified influenza virus with other respiratory manifestations: Secondary | ICD-10-CM | POA: Diagnosis not present

## 2022-10-21 DIAGNOSIS — I10 Essential (primary) hypertension: Secondary | ICD-10-CM | POA: Insufficient documentation

## 2022-10-21 DIAGNOSIS — R509 Fever, unspecified: Secondary | ICD-10-CM | POA: Diagnosis present

## 2022-10-21 LAB — RESP PANEL BY RT-PCR (RSV, FLU A&B, COVID)  RVPGX2
Influenza A by PCR: NEGATIVE
Influenza B by PCR: POSITIVE — AB
Resp Syncytial Virus by PCR: NEGATIVE
SARS Coronavirus 2 by RT PCR: NEGATIVE

## 2022-10-21 MED ORDER — OSELTAMIVIR PHOSPHATE 75 MG PO CAPS: 75.0000 mg | ORAL_CAPSULE | Freq: Two times a day (BID) | ORAL | 0 refills | Status: DC

## 2022-10-21 MED ORDER — BENZONATATE 100 MG PO CAPS: 100.0000 mg | ORAL_CAPSULE | Freq: Three times a day (TID) | ORAL | 0 refills | Status: DC

## 2022-10-21 MED ORDER — ACETAMINOPHEN 500 MG PO TABS
1000.0000 mg | ORAL_TABLET | Freq: Once | ORAL | Status: AC
  Administered 2022-10-21: 1000 mg via ORAL
  Filled 2022-10-21: qty 2

## 2022-10-21 MED ORDER — ONDANSETRON 4 MG PO TBDP
4.0000 mg | ORAL_TABLET | Freq: Once | ORAL | Status: AC
  Administered 2022-10-21: 4 mg via ORAL
  Filled 2022-10-21: qty 1

## 2022-10-21 MED ORDER — ACETAMINOPHEN 500 MG PO TABS: 500.0000 mg | ORAL_TABLET | Freq: Four times a day (QID) | ORAL | 0 refills | Status: DC | PRN

## 2022-10-21 NOTE — Discharge Instructions (Signed)
You have tested positive for influenza B.  Please take medication prescribed as treatment of your symptoms.  Return to ER if you have any concern.

## 2022-10-21 NOTE — ED Provider Notes (Signed)
[facility] Provider Note   CSN: [id number] Arrival date & time: 10/21/22  [id number]     History  Chief Complaint  Patient presents with   Influenza    Taylor Sherman is a 37 y.o. female.  The history is provided by the patient and medical records. No language interpreter was used.  Influenza    37 year old female significant for hypertension, anxiety, asthma, presenting with flulike symptoms.  Patient report her friend has had flulike symptoms for the past 5 days and yesterday she was diagnosed with influenza.  Patient reports since yesterday she also developed similar symptoms which includes fever chills body aches cough congestion and associate nausea and vomiting.  She is concerned that she is having the same infection.  She mention she tried to drink some herbal tea this morning but cannot keep it down prompting this ER visit.  She denies worsening shortness of breath.  No urinary symptoms.  Home Medications Prior to Admission medications   Medication Sig Start Date End Date Taking? Authorizing Provider  Blood Pressure Monitoring (BLOOD PRESSURE KIT) DEVI 1 kit by Does not apply route once a week. 05/19/20   Cephas Darby, MD  NIFEdipine (ADALAT CC) 60 MG 24 hr tablet Take 1 tablet (60 mg total) by mouth daily. 11/21/20   Donnamae Jude, MD  Prenatal Vit-Fe Phos-FA-Omega (VITAFOL GUMMIES) 3.33-0.333-34.8 MG CHEW Chew 3 tablets by mouth daily. 05/19/20   Cephas Darby, MD      Allergies    Patient has no known allergies.    Review of Systems   Review of Systems  All other systems reviewed and are negative.   Physical Exam Updated Vital Signs BP (!) 137/96 (BP Location: Right Arm)   Pulse 87   Temp 99.2 F (37.3 C) (Oral)   Resp 20   Ht 5' 2"$  (1.575 m)   Wt 56.2 kg   SpO2 100%   BMI 22.68 kg/m  Physical Exam Vitals and nursing note reviewed.  Constitutional:      General: She is not in acute distress.     Appearance: She is well-developed.  HENT:     Head: Atraumatic.  Eyes:     Conjunctiva/sclera: Conjunctivae normal.  Cardiovascular:     Rate and Rhythm: Normal rate and regular rhythm.     Pulses: Normal pulses.     Heart sounds: Normal heart sounds.  Pulmonary:     Effort: Pulmonary effort is normal.     Breath sounds: No wheezing, rhonchi or rales.  Abdominal:     Palpations: Abdomen is soft.  Musculoskeletal:     Cervical back: Normal range of motion and neck supple. No rigidity.  Skin:    Findings: No rash.  Neurological:     Mental Status: She is alert.  Psychiatric:        Mood and Affect: Mood normal.     ED Results / Procedures / Treatments   Labs (all labs ordered are listed, but only abnormal results are displayed) Labs Reviewed  RESP PANEL BY RT-PCR (RSV, FLU A&B, COVID)  RVPGX2 - Abnormal; Notable for the following components:      Result Value   Influenza B by PCR POSITIVE (*)    All other components within normal limits    EKG None  Radiology No results found.  Procedures Procedures    Medications Ordered in ED Medications  ondansetron (ZOFRAN-ODT) disintegrating tablet 4 mg (has no administration in time  range)  acetaminophen (TYLENOL) tablet 1,000 mg (has no administration in time range)    ED Course/ Medical Decision Making/ A&P                             Medical Decision Making Risk OTC drugs. Prescription drug management.   BP (!) 137/96 (BP Location: Right Arm)   Pulse 87   Temp 99.2 F (37.3 C) (Oral)   Resp 20   Ht 5' 2"$  (1.575 m)   Wt 56.2 kg   SpO2 100%   BMI 22.68 kg/m   25:71 AM  37 year old female significant for hypertension, anxiety, asthma, presenting with flulike symptoms.  Patient report her friend has had flulike symptoms for the past 5 days and yesterday she was diagnosed with influenza.  Patient reports since yesterday she also developed similar symptoms which includes fever chills body aches cough congestion  and associate nausea and vomiting.  She is concerned that she is having the same infection.  She mention she tried to drink some herbal tea this morning but cannot keep it down prompting this ER visit.  She denies worsening shortness of breath.  No urinary symptoms.  On exam, patient is actively coughing appears uncomfortable but nontoxic.  She does have some slightly diminished breath sounds but no overt wheezes rales or rhonchi heard.  She is moving all 4 extremities skin is warm to the touch.  Vital sign review and overall reassuring.  Since patient has recent exposure to flu symptoms consistent with influenza.  She is within the 48-hour window to treat with Tamiflu.  Will prescribe Tamiflu as well as antinausea medication.  Record reviewed, prior imaging reviewed, patient has never been diagnosed with pneumonia in the past.  Differential diagnosis includes flu, pneumonia, RSV, COVID, viral illness.  Doubt pneumonia based on presentation and duration of symptoms.  Social determinant of health including tobacco use recommend cessation.  10:34 AM Labs obtained independently viewed interpreted by me patient test positive for influenza B which is consistent with her presentation.  Will discharge home with supportive care as well as Tamiflu.  Patient to take Tylenol or ibuprofen as needed for aches and pain and fever.  Return precaution given.  Patient also receiving Tylenol here with improvement of symptoms.        Final Clinical Impression(s) / ED Diagnoses Final diagnoses:  Influenza B    Rx / DC Orders ED Discharge Orders          Ordered    oseltamivir (TAMIFLU) 75 MG capsule  Every 12 hours        10/21/22 1036    acetaminophen (TYLENOL) 500 MG tablet  Every 6 hours PRN        10/21/22 1036    benzonatate (TESSALON) 100 MG capsule  Every 8 hours        10/21/22 1036              Domenic Moras, PA-C 10/21/22 1037    Cristie Hem, MD 10/21/22 1525

## 2022-10-21 NOTE — ED Triage Notes (Signed)
Was here yesterday with partner who was diagnosed with the flu and now has cough, sob and headache.

## 2023-02-18 ENCOUNTER — Emergency Department (HOSPITAL_COMMUNITY)
Admission: EM | Admit: 2023-02-18 | Discharge: 2023-02-18 | Disposition: A | Discharge status: Home / Self Care | Payer: 59 | Attending: Emergency Medicine | Admitting: Emergency Medicine

## 2023-02-18 ENCOUNTER — Other Ambulatory Visit: Payer: Self-pay

## 2023-02-18 ENCOUNTER — Encounter (HOSPITAL_COMMUNITY): Payer: Self-pay

## 2023-02-18 DIAGNOSIS — I10 Essential (primary) hypertension: Secondary | ICD-10-CM

## 2023-02-18 DIAGNOSIS — J45909 Unspecified asthma, uncomplicated: Secondary | ICD-10-CM | POA: Diagnosis not present

## 2023-02-18 DIAGNOSIS — Z79899 Other long term (current) drug therapy: Secondary | ICD-10-CM | POA: Diagnosis not present

## 2023-02-18 DIAGNOSIS — Z202 Contact with and (suspected) exposure to infections with a predominantly sexual mode of transmission: Secondary | ICD-10-CM | POA: Diagnosis not present

## 2023-02-18 LAB — WET PREP, GENITAL
Sperm: NONE SEEN
Trich, Wet Prep: NONE SEEN
WBC, Wet Prep HPF POC: <10 (ref <10)
Yeast Wet Prep HPF POC: NONE SEEN

## 2023-02-18 LAB — HIV ANTIBODY (ROUTINE TESTING W REFLEX): HIV Screen 4th Generation wRfx: NONREACTIVE

## 2023-02-18 LAB — RPR: RPR Ser Ql: NONREACTIVE

## 2023-02-18 MED ORDER — AMLODIPINE BESYLATE 5 MG PO TABS
5.0000 mg | ORAL_TABLET | Freq: Once | ORAL | Status: AC
  Administered 2023-02-18: 5 mg via ORAL
  Filled 2023-02-18: qty 1

## 2023-02-18 MED ORDER — METRONIDAZOLE 500 MG PO TABS: 500.0000 mg | ORAL_TABLET | Freq: Two times a day (BID) | ORAL | 0 refills | Status: DC

## 2023-02-18 MED ORDER — AMLODIPINE BESYLATE 5 MG PO TABS: 5.0000 mg | ORAL_TABLET | Freq: Every day | ORAL | 2 refills | Status: DC

## 2023-02-18 NOTE — ED Triage Notes (Signed)
Pt arrived POV from home stating she wants to be checked for STDs. Pt denies any symptoms or pain but is tearful in triage.

## 2023-02-18 NOTE — Discharge Instructions (Addendum)
Return to the ED with any new or worsening signs or symptoms Please follow-up on the results of your testing done here today on MyChart.  If any of your testing is positive, please follow-up with [facility] department, return to this ED or see PCP I referred you to for further treatment Please begin taking 5 mg amlodipine once daily for high blood pressure Please call and make an appointment with PCP I referred you to to establish care Please begin taking metronidazole twice daily for the next 7 days.  This is for bacterial vaginosis.  Please be aware that you cannot drink alcohol while taking metronidazole.
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
## 2023-02-20 LAB — GC/CHLAMYDIA PROBE AMP (~~LOC~~) NOT AT ARMC
Chlamydia: NEGATIVE
Comment: NEGATIVE
Comment: NORMAL
Neisseria Gonorrhea: NEGATIVE

## 2023-02-21 LAB — HSV 2 ANTIBODY, IGG: HSV 2 Glycoprotein G Ab, IgG: 8.83 index — ABNORMAL HIGH (ref 0.00–0.90)

## 2023-02-21 LAB — HSV 1 ANTIBODY, IGG: HSV 1 Glycoprotein G Ab, IgG: <0.91 index (ref 0.00–0.90)

## 2023-08-17 DIAGNOSIS — G40909 Epilepsy, unspecified, not intractable, without status epilepticus: Secondary | ICD-10-CM | POA: Diagnosis not present

## 2023-08-17 DIAGNOSIS — Z833 Family history of diabetes mellitus: Secondary | ICD-10-CM | POA: Diagnosis not present

## 2023-08-17 DIAGNOSIS — J45909 Unspecified asthma, uncomplicated: Secondary | ICD-10-CM | POA: Diagnosis not present

## 2023-08-17 DIAGNOSIS — Z818 Family history of other mental and behavioral disorders: Secondary | ICD-10-CM | POA: Diagnosis not present

## 2023-08-17 DIAGNOSIS — F1421 Cocaine dependence, in remission: Secondary | ICD-10-CM | POA: Diagnosis not present

## 2023-08-17 DIAGNOSIS — I10 Essential (primary) hypertension: Secondary | ICD-10-CM | POA: Diagnosis not present

## 2023-08-17 DIAGNOSIS — Z8541 Personal history of malignant neoplasm of cervix uteri: Secondary | ICD-10-CM | POA: Diagnosis not present

## 2023-08-17 DIAGNOSIS — Z8249 Family history of ischemic heart disease and other diseases of the circulatory system: Secondary | ICD-10-CM | POA: Diagnosis not present

## 2023-08-17 DIAGNOSIS — Z87892 Personal history of anaphylaxis: Secondary | ICD-10-CM | POA: Diagnosis not present

## 2023-08-17 DIAGNOSIS — F1021 Alcohol dependence, in remission: Secondary | ICD-10-CM | POA: Diagnosis not present

## 2023-08-17 DIAGNOSIS — Z823 Family history of stroke: Secondary | ICD-10-CM | POA: Diagnosis not present

## 2023-08-17 DIAGNOSIS — Z885 Allergy status to narcotic agent status: Secondary | ICD-10-CM | POA: Diagnosis not present

## 2023-09-04 ENCOUNTER — Other Ambulatory Visit: Payer: Self-pay | Admitting: Family

## 2023-09-04 ENCOUNTER — Encounter: Payer: 59 | Admitting: Family

## 2023-09-04 DIAGNOSIS — Z23 Encounter for immunization: Secondary | ICD-10-CM

## 2023-09-04 DIAGNOSIS — I10 Essential (primary) hypertension: Secondary | ICD-10-CM

## 2023-09-04 DIAGNOSIS — Z716 Tobacco abuse counseling: Secondary | ICD-10-CM

## 2023-09-04 DIAGNOSIS — Z7689 Persons encountering health services in other specified circumstances: Secondary | ICD-10-CM | POA: Diagnosis not present

## 2023-09-04 MED ORDER — AMLODIPINE BESYLATE 5 MG PO TABS: 5.0000 mg | ORAL_TABLET | Freq: Every day | ORAL | 0 refills | Status: DC

## 2023-09-04 NOTE — Addendum Note (Signed)
Addended by: Claudie Leach on: 09/04/2023 09:31 AM   Modules accepted: Orders

## 2023-09-04 NOTE — Progress Notes (Addendum)
Subjective:    Taylor Sherman - 37 y.o. female MRN [id number]  Date of birth: 06-28-1986  HPI  Taylor Sherman is to establish care.   Current issues and/or concerns: - States has been almost 1 year since she has taken Amlodipine due to needing to establish with Primary Care. Reports she was doing well on Amlodipine, no issues/concerns. She does not check blood pressure outside of office. She limits salt intake. She does not exercise outside of normal routine. She does not complain of red flag symptoms such as but not limited to chest pain, shortness of breath, worst headache of life, nausea/vomiting.  - Smoking. She is not ready to begin medication to help. - No further issues/concerns for discussion today.    ROS per HPI     Health Maintenance:  Health Maintenance Due  Topic Date Due   INFLUENZA VACCINE  04/06/2023   COVID-19 Vaccine (1 - 2024-25 season) Never done     Past Medical History: Patient Active Problem List   Diagnosis Date Noted   Encounter for postpartum visit 01/19/2021   Hypertension    Cocaine use complicating pregnancy 11/18/2020   Marijuana use 11/18/2020   Tobacco use in pregnancy 11/18/2020   IUGR (intrauterine growth restriction) affecting care of mother 11/18/2020   Vaginal delivery 11/18/2020   Severe preeclampsia 11/18/2020   Supervision of high risk pregnancy, antepartum 11/17/2020   [redacted] weeks gestation of pregnancy 10/05/2020   Hx LEEP (loop electrosurgical excision procedure), cervix, pregnancy, first trimester 06/03/2020   [redacted] weeks gestation of pregnancy 06/03/2020   Chronic pre-existing hypertension during pregnancy 02/24/2018   Cervical intraepithelial neoplasia grade III with severe dysplasia 02/15/2018      Social History   reports that she has quit smoking. Her smoking use included cigarettes. She has never used smokeless tobacco. She reports that she does not currently use alcohol. She reports that she does not use drugs.   Family  History  family history includes Diabetes in her mother; Heart disease in her maternal grandmother; Hypertension in her maternal grandmother and mother.   Medications: reviewed and updated   Objective:   Physical Exam BP (!) 148/95   Pulse (!) 57   Temp 98.4 F (36.9 C) (Oral)   Ht 5\' 2"  (1.575 m)   Wt 130 lb 9.6 oz (59.2 kg)   LMP 07/26/2023 (Approximate)   SpO2 98%   BMI 23.89 kg/m   Physical Exam HENT:     Head: Normocephalic and atraumatic.     Nose: Nose normal.     Mouth/Throat:     Mouth: Mucous membranes are moist.     Pharynx: Oropharynx is clear.  Eyes:     Extraocular Movements: Extraocular movements intact.     Conjunctiva/sclera: Conjunctivae normal.     Pupils: Pupils are equal, round, and reactive to light.  Cardiovascular:     Rate and Rhythm: Bradycardia present.     Pulses: Normal pulses.     Heart sounds: Normal heart sounds.  Pulmonary:     Effort: Pulmonary effort is normal.     Breath sounds: Normal breath sounds.  Musculoskeletal:        General: Normal range of motion.     Cervical back: Normal range of motion and neck supple.  Neurological:     General: No focal deficit present.     Mental Status: She is alert and oriented to person, place, and time.  Psychiatric:        Mood and  Affect: Mood normal.        Behavior: Behavior normal.       Assessment & Plan:  Encounter to establish care (Primary) - Patient presents today to establish care. During the interim follow-up with primary provider as scheduled.  - Return for annual physical examination, labs, and health maintenance. Arrive fasting meaning having no food for at least 8 hours prior to appointment. You may have only water or black coffee. Please take scheduled medications as normal.  Primary hypertension  - Blood pressure not at goal during today's visit. Patient asymptomatic without chest pressure, chest pain, palpitations, shortness of breath, worst headache of life, and any  additional red flag symptoms. - Resume Amlodipine as prescribed.  - Routine screening.  - Counseled on blood pressure goal of less than 130/80, low-sodium, DASH diet, medication compliance, and 150 minutes of moderate intensity exercise per week as tolerated. Counseled on medication adherence and adverse effects. - Follow-up with primary provider in 4 weeks or sooner if needed.   Encounter for smoking cessation counseling  - Counseled to quit.   - Patient declined pharmacological therapy.    Patient was given clear instructions to go to Emergency Department or return to medical center if symptoms don't improve, worsen, or new problems develop.The patient verbalized understanding.  I discussed the assessment and treatment plan with the patient. The patient was provided an opportunity to ask questions and all were answered. The patient agreed with the plan and demonstrated an understanding of the instructions.   The patient was advised to call back or seek an in-person evaluation if the symptoms worsen or if the condition fails to improve as anticipated.    Ricky Stabs, NP 09/04/2023, 10:18 AM Primary Care at [facility]

## 2023-09-04 NOTE — Progress Notes (Signed)
Patient states she was diagnosed with high blood pressure years back.  Wants Flu vaccine.

## 2023-09-05 LAB — SPECIMEN STATUS REPORT

## 2023-09-08 LAB — BASIC METABOLIC PANEL
BUN/Creatinine Ratio: 13 (ref 9–23)
BUN: 11 mg/dL (ref 6–20)
CO2: 21 mmol/L (ref 20–29)
Calcium: 9.6 mg/dL (ref 8.7–10.2)
Chloride: 106 mmol/L (ref 96–106)
Creatinine, Ser: 0.84 mg/dL (ref 0.57–1.00)
Glucose: 68 mg/dL — ABNORMAL LOW (ref 70–99)
Potassium: 4.7 mmol/L (ref 3.5–5.2)
Sodium: 141 mmol/L (ref 134–144)
eGFR: 92 mL/min/{1.73_m2} (ref >59)

## 2023-09-08 LAB — SPECIMEN STATUS REPORT

## 2023-10-05 ENCOUNTER — Ambulatory Visit (INDEPENDENT_AMBULATORY_CARE_PROVIDER_SITE_OTHER): Payer: 59 | Admitting: Family

## 2023-10-05 ENCOUNTER — Encounter: Payer: Self-pay | Admitting: Family

## 2023-10-05 VITALS — BP 130/87 | HR 67 | Temp 98.2°F | Ht 62.0 in | Wt 132.8 lb

## 2023-10-05 DIAGNOSIS — I10 Essential (primary) hypertension: Secondary | ICD-10-CM | POA: Diagnosis not present

## 2023-10-05 DIAGNOSIS — R110 Nausea: Secondary | ICD-10-CM | POA: Diagnosis not present

## 2023-10-05 LAB — POCT RAPID STREP A (OFFICE): Rapid Strep A Screen: NEGATIVE

## 2023-10-05 MED ORDER — ONDANSETRON 4 MG PO TBDP: 4.0000 mg | ORAL_TABLET | Freq: Three times a day (TID) | ORAL | 1 refills | Status: DC | PRN

## 2023-10-05 MED ORDER — AMLODIPINE BESYLATE 5 MG PO TABS: 5.0000 mg | ORAL_TABLET | Freq: Every day | ORAL | 0 refills | Status: DC

## 2023-10-05 NOTE — Progress Notes (Signed)
Patient state she has been have nausea for last 3 days.

## 2023-10-05 NOTE — Progress Notes (Signed)
Patient ID: Taylor Sherman, female    DOB: 1985-09-11  MRN: [id number]  CC: Chronic Conditions Follow-Up  Subjective: Taylor Sherman is a 38 y.o. female who presents for chronic conditions follow-up.   Her concerns today include:  - Doing well on Amlodipine, no issues/concerns. She does not complain of red flag symptoms such as but not limited to chest pain, shortness of breath, worst headache of life, nausea/vomiting.  - Nausea for several days. Denies additional symptoms. Declines pregnancy test due to history of tubal ligation.    Patient Active Problem List   Diagnosis Date Noted   Encounter for postpartum visit 01/19/2021   Hypertension    Cocaine use complicating pregnancy 11/18/2020   Marijuana use 11/18/2020   Tobacco use in pregnancy 11/18/2020   IUGR (intrauterine growth restriction) affecting care of mother 11/18/2020   Vaginal delivery 11/18/2020   Severe preeclampsia 11/18/2020   Supervision of high risk pregnancy, antepartum 11/17/2020   [redacted] weeks gestation of pregnancy 10/05/2020   Hx LEEP (loop electrosurgical excision procedure), cervix, pregnancy, first trimester 06/03/2020   [redacted] weeks gestation of pregnancy 06/03/2020   Chronic pre-existing hypertension during pregnancy 02/24/2018   Cervical intraepithelial neoplasia grade III with severe dysplasia 02/15/2018     Current Outpatient Medications on File Prior to Visit  Medication Sig Dispense Refill   benzonatate (TESSALON) 100 MG capsule Take 1 capsule (100 mg total) by mouth every 8 (eight) hours. (Patient not taking: Reported on 10/05/2023) 21 capsule 0   Blood Pressure Monitoring (BLOOD PRESSURE KIT) DEVI 1 kit by Does not apply route once a week. (Patient not taking: Reported on 09/04/2023) 1 each 0   metroNIDAZOLE (FLAGYL) 500 MG tablet Take 1 tablet (500 mg total) by mouth 2 (two) times daily. (Patient not taking: Reported on 10/05/2023) 14 tablet 0   NIFEdipine (ADALAT CC) 60 MG 24 hr tablet Take 1 tablet (60  mg total) by mouth daily. (Patient not taking: Reported on 10/05/2023) 30 tablet 1   oseltamivir (TAMIFLU) 75 MG capsule Take 1 capsule (75 mg total) by mouth every 12 (twelve) hours. (Patient not taking: Reported on 10/05/2023) 10 capsule 0   Prenatal Vit-Fe Phos-FA-Omega (VITAFOL GUMMIES) 3.33-0.333-34.8 MG CHEW Chew 3 tablets by mouth daily. (Patient not taking: Reported on 09/04/2023) 90 tablet 12   No current facility-administered medications on file prior to visit.    No Known Allergies  Social History   Socioeconomic History   Marital status: Single    Spouse name: Not on file   Number of children: Not on file   Years of education: Not on file   Highest education level: Not on file  Occupational History   Not on file  Tobacco Use   Smoking status: Former    Types: Cigarettes   Smokeless tobacco: Never   Tobacco comments:    about 2 cigs a day  Vaping Use   Vaping status: Some Days  Substance and Sexual Activity   Alcohol use: Not Currently    Comment: Last drink was a month ago   Drug use: No   Sexual activity: Yes    Partners: Male    Birth control/protection: None  Other Topics Concern   Not on file  Social History Narrative   Not on file   Social Drivers of Health   Financial Resource Strain: Not on file  Food Insecurity: Not on file  Transportation Needs: Not on file  Physical Activity: Not on file  Stress: Not on file  Social Connections: Not on file  Intimate Partner Violence: Not on file    Family History  Problem Relation Age of Onset   Hypertension Mother    Diabetes Mother    Hypertension Maternal Grandmother    Heart disease Maternal Grandmother     Past Surgical History:  Procedure Laterality Date   LEEP     TUBAL LIGATION N/A 11/19/2020   Procedure: POST PARTUM TUBAL LIGATION;  Surgeon: Pemberwick Bing, MD;  Location: [facility] LD [facility]S;  Service: Gynecology;  Laterality: N/A;    ROS: Review of Systems Negative except as stated  above  PHYSICAL EXAM: BP 130/87   Pulse 67   Temp 98.2 F (36.8 C) (Oral)   Ht 5\' 2"  (1.575 m)   Wt 132 lb 12.8 oz (60.2 kg)   SpO2 98%   BMI 24.29 kg/m   Physical Exam HENT:     Head: Normocephalic and atraumatic.     Nose: Nose normal.     Mouth/Throat:     Mouth: Mucous membranes are moist.     Pharynx: Oropharynx is clear.  Eyes:     Extraocular Movements: Extraocular movements intact.     Conjunctiva/sclera: Conjunctivae normal.     Pupils: Pupils are equal, round, and reactive to light.  Cardiovascular:     Rate and Rhythm: Normal rate and regular rhythm.     Pulses: Normal pulses.     Heart sounds: Normal heart sounds.  Pulmonary:     Effort: Pulmonary effort is normal.     Breath sounds: Normal breath sounds.  Musculoskeletal:        General: Normal range of motion.     Cervical back: Normal range of motion and neck supple.  Neurological:     General: No focal deficit present.     Mental Status: She is alert and oriented to person, place, and time.  Psychiatric:        Mood and Affect: Mood normal.        Behavior: Behavior normal.     ASSESSMENT AND PLAN: 1. Primary hypertension (Primary) - Continue Amlodipine as prescribed.  - Counseled on blood pressure goal of less than 130/80, low-sodium, DASH diet, medication compliance, and 150 minutes of moderate intensity exercise per week as tolerated. Counseled on medication adherence and adverse effects. - Follow-up with primary provider in 3 months or sooner if needed.  - amLODipine (NORVASC) 5 MG tablet; Take 1 tablet (5 mg total) by mouth daily.  Dispense: 90 tablet; Refill: 0  2. Nausea - Ondansetron as prescribed. Counseled on medication adherence/adverse effects.  - Routine screening.  - Patient declined pregnancy test.  - Follow-up with primary provider as scheduled.  - ondansetron (ZOFRAN-ODT) 4 MG disintegrating tablet; Take 1 tablet (4 mg total) by mouth every 8 (eight) hours as needed.  Dispense:  30 tablet; Refill: 1 - COVID-19, Flu A+B and RSV - POCT rapid strep A; Future - Culture, Group A Strep   Patient was given the opportunity to ask questions.  Patient verbalized understanding of the plan and was able to repeat key elements of the plan. Patient was given clear instructions to go to Emergency Department or return to medical center if symptoms don't improve, worsen, or new problems develop.The patient verbalized understanding.   Orders Placed This Encounter  Procedures   COVID-19, Flu A+B and RSV   Culture, Group A Strep   POCT rapid strep A     Requested Prescriptions   Signed Prescriptions Disp Refills  amLODipine (NORVASC) 5 MG tablet 90 tablet 0    Sig: Take 1 tablet (5 mg total) by mouth daily.   ondansetron (ZOFRAN-ODT) 4 MG disintegrating tablet 30 tablet 1    Sig: Take 1 tablet (4 mg total) by mouth every 8 (eight) hours as needed.    Return in about 3 months (around 01/03/2024) for Follow-Up or next available chronic conditions.  Rema Fendt, NP

## 2023-10-06 LAB — COVID-19, FLU A+B AND RSV
Influenza A, NAA: NOT DETECTED
Influenza B, NAA: NOT DETECTED
RSV, NAA: NOT DETECTED
SARS-CoV-2, NAA: NOT DETECTED

## 2023-10-08 LAB — CULTURE, GROUP A STREP: Strep A Culture: NEGATIVE

## 2024-01-03 ENCOUNTER — Ambulatory Visit (INDEPENDENT_AMBULATORY_CARE_PROVIDER_SITE_OTHER): Payer: 59 | Admitting: Family

## 2024-01-03 ENCOUNTER — Encounter: Payer: Self-pay | Admitting: Family

## 2024-01-03 VITALS — BP 169/101 | HR 68 | Temp 98.1°F | Resp 18 | Ht 62.0 in | Wt 135.8 lb

## 2024-01-03 DIAGNOSIS — G43909 Migraine, unspecified, not intractable, without status migrainosus: Secondary | ICD-10-CM | POA: Diagnosis not present

## 2024-01-03 DIAGNOSIS — M170 Bilateral primary osteoarthritis of knee: Secondary | ICD-10-CM | POA: Diagnosis not present

## 2024-01-03 DIAGNOSIS — I10 Essential (primary) hypertension: Secondary | ICD-10-CM

## 2024-01-03 DIAGNOSIS — M1712 Unilateral primary osteoarthritis, left knee: Secondary | ICD-10-CM | POA: Diagnosis not present

## 2024-01-03 DIAGNOSIS — M1711 Unilateral primary osteoarthritis, right knee: Secondary | ICD-10-CM

## 2024-01-03 MED ORDER — SUMATRIPTAN SUCCINATE 25 MG PO TABS: ORAL_TABLET | ORAL | 2 refills | Status: AC

## 2024-01-03 MED ORDER — AMLODIPINE BESYLATE 10 MG PO TABS: 10.0000 mg | ORAL_TABLET | Freq: Every day | ORAL | 0 refills | Status: DC

## 2024-01-03 NOTE — Progress Notes (Signed)
 3 month follow up.  Migraine for 3 days.  Referral for arthritis

## 2024-01-03 NOTE — Progress Notes (Signed)
 Patient ID: Taylor Sherman, female    DOB: [birth year]  MRN: [id number]  CC: Chronic Conditions Follow-Up  Subjective: Taylor Sherman is a 38 y.o. female who presents for chronic conditions follow-up.   Her concerns today include:  - Doing well on Amlodipine , no issues/concerns. She does not complain of red flag symptoms such as but not limited to chest pain, shortness of breath, worst headache of life, nausea/vomiting.  - States migraine for 3 days. Denies red flag symptoms. Taking over-the-counter medication with minimal relief.  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 Denies red flag symptoms. States in the past knee had to be drained to help. Requests referral to specialist.  - Reports she is not breastfeeding.  Patient Active Problem List   Diagnosis Date Noted   Encounter for postpartum visit 01/19/2021   Hypertension    Cocaine use complicating pregnancy 11/18/2020   Marijuana use 11/18/2020   Tobacco use in pregnancy 11/18/2020   IUGR (intrauterine growth restriction) affecting care of mother 11/18/2020   Vaginal delivery 11/18/2020   Severe preeclampsia 11/18/2020   Supervision of high risk pregnancy, antepartum 11/17/2020   [redacted] weeks gestation of pregnancy 10/05/2020   Hx LEEP (loop electrosurgical excision procedure), cervix, pregnancy, first trimester 06/03/2020   [redacted] weeks gestation of pregnancy 06/03/2020   Chronic pre-existing hypertension during pregnancy 02/24/2018   Cervical intraepithelial neoplasia grade III with severe dysplasia 02/15/2018     Current Outpatient Medications on File Prior to Visit  Medication Sig Dispense Refill   benzonatate  (TESSALON ) 100 MG capsule Take 1 capsule (100 mg total) by mouth every 8 (eight) hours. (Patient not taking: Reported on 10/05/2023) 21 capsule 0   Blood Pressure Monitoring (BLOOD PRESSURE KIT) DEVI 1 kit by Does not apply route once a week. (Patient not taking: Reported on 09/04/2023) 1 each 0   metroNIDAZOLE   (FLAGYL ) 500 MG tablet Take 1 tablet (500 mg total) by mouth 2 (two) times daily. (Patient not taking: Reported on 10/05/2023) 14 tablet 0   NIFEdipine  (ADALAT  CC) 60 MG 24 hr tablet Take 1 tablet (60 mg total) by mouth daily. (Patient not taking: Reported on 10/05/2023) 30 tablet 1   ondansetron  (ZOFRAN -ODT) 4 MG disintegrating tablet Take 1 tablet (4 mg total) by mouth every 8 (eight) hours as needed. (Patient not taking: Reported on 01/03/2024) 30 tablet 1   oseltamivir  (TAMIFLU ) 75 MG capsule Take 1 capsule (75 mg total) by mouth every 12 (twelve) hours. (Patient not taking: Reported on 10/05/2023) 10 capsule 0   Prenatal Vit-Fe Phos-FA-Omega (VITAFOL  GUMMIES) 3.33-0.333-34.8 MG CHEW Chew 3 tablets by mouth daily. (Patient not taking: Reported on 09/04/2023) 90 tablet 12   No current facility-administered medications on file prior to visit.    No Known Allergies  Social History   Socioeconomic History   Marital status: Single    Spouse name: Not on file   Number of children: Not on file   Years of education: Not on file   Highest education level: Not on file  Occupational History   Not on file  Tobacco Use   Smoking status: Former    Types: Cigarettes   Smokeless tobacco: Never   Tobacco comments:    about 2 cigs a day  Vaping Use   Vaping status: Some Days  Substance and Sexual Activity   Alcohol use: Not Currently    Comment: Last drink was a month ago   Drug use: No   Sexual activity: Yes    Partners:  Male    Birth control/protection: None  Other Topics Concern   Not on file  Social History Narrative   Not on file   Social Drivers of Health   Financial Resource Strain: Low Risk  (10/05/2023)   Overall Financial Resource Strain (CARDIA)    Difficulty of Paying Living Expenses: Not hard at all  Food Insecurity: No Food Insecurity (10/05/2023)   Hunger Vital Sign    Worried About Running Out of Food in the Last Year: Never true    Ran Out of Food in the Last Year: Never  true  Transportation Needs: No Transportation Needs (10/05/2023)   PRAPARE - Administrator, Civil Service (Medical): No    Lack of Transportation (Non-Medical): No  Physical Activity: Sufficiently Active (01/03/2024)   Exercise Vital Sign    Days of Exercise per Week: 7 days    Minutes of Exercise per Session: 30 min  Stress: No Stress Concern Present (10/05/2023)   [employer] of Occupational Health - Occupational Stress Questionnaire    Feeling of Stress : Only a little  Social Connections: Moderately Isolated (01/03/2024)   Social Connection and Isolation Panel [NHANES]    Frequency of Communication with Friends and Family: More than three times a week    Frequency of Social Gatherings with Friends and Family: More than three times a week    Attends Religious Services: 1 to 4 times per year    Active Member of [employer] or Organizations: No    Attends Banker Meetings: Never    Marital Status: Never married  Intimate Partner Violence: Not At Risk (10/05/2023)   Humiliation, Afraid, Rape, and Kick questionnaire    Fear of Current or Ex-Partner: No    Emotionally Abused: No    Physically Abused: No    Sexually Abused: No    Family History  Problem Relation Age of Onset   Hypertension Mother    Diabetes Mother    Hypertension Maternal Grandmother    Heart disease Maternal Grandmother     Past Surgical History:  Procedure Laterality Date   LEEP     TUBAL LIGATION N/A 11/19/2020   Procedure: POST PARTUM TUBAL LIGATION;  Surgeon: Raynell Caller, MD;  Location: [facility] LD ORS;  Service: Gynecology;  Laterality: N/A;    ROS: Review of Systems Negative except as stated above  PHYSICAL EXAM: BP (!) 169/101   Pulse 68   Temp 98.1 F (36.7 C) (Oral)   Resp 18   Ht 5\' 2"  (1.575 m)   Wt 135 lb 12.8 oz (61.6 kg)   LMP 12/18/2023 (Approximate)   SpO2 98%   BMI 24.84 kg/m   Physical Exam HENT:     Head: Normocephalic and atraumatic.     Nose: Nose  normal.     Mouth/Throat:     Mouth: Mucous membranes are moist.     Pharynx: Oropharynx is clear.  Eyes:     Extraocular Movements: Extraocular movements intact.     Conjunctiva/sclera: Conjunctivae normal.     Pupils: Pupils are equal, round, and reactive to light.  Cardiovascular:     Rate and Rhythm: Normal rate and regular rhythm.     Pulses: Normal pulses.     Heart sounds: Normal heart sounds.  Pulmonary:     Effort: Pulmonary effort is normal.     Breath sounds: Normal breath sounds.  Musculoskeletal:        General: Normal range of motion.     Right  shoulder: Normal.     Left shoulder: Normal.     Right upper arm: Normal.     Left upper arm: Normal.     Right elbow: Normal.     Left elbow: Normal.     Right forearm: Normal.     Left forearm: Normal.     Right wrist: Normal.     Left wrist: Normal.     Right hand: Normal.     Left hand: Normal.     Cervical back: Normal, normal range of motion and neck supple.     Thoracic back: Normal.     Lumbar back: Normal.     Right hip: Normal.     Left hip: Normal.     Right upper leg: Normal.     Left upper leg: Normal.     Right knee: Normal.     Left knee: Normal.     Right lower leg: Normal.     Left lower leg: Normal.     Right ankle: Normal.     Left ankle: Normal.     Right foot: Normal.     Left foot: Normal.  Neurological:     General: No focal deficit present.     Mental Status: She is alert and oriented to person, place, and time.  Psychiatric:        Mood and Affect: Mood normal.        Behavior: Behavior normal.      ASSESSMENT AND PLAN: 1. Primary hypertension (Primary) - Blood pressure not at goal during today's visit. Patient asymptomatic without chest pressure, chest pain, palpitations, shortness of breath, worst headache of life, and any additional red flag symptoms. - Increase Amlodipine  from 5 mg to 10 mg as prescribed.  - Counseled on blood pressure goal of less than 130/80, low-sodium,  DASH diet, medication compliance, and 150 minutes of moderate intensity exercise per week as tolerated. Counseled on medication adherence and adverse effects. - Follow-up with primary provider in 4 weeks or sooner if needed.  - amLODipine  (NORVASC ) 10 MG tablet; Take 1 tablet (10 mg total) by mouth daily.  Dispense: 90 tablet; Refill: 0  2. Migraine without status migrainosus, not intractable, unspecified migraine type - Sumatriptan as prescribed. Counseled on medication adherence/adverse effects.  - Follow-up with primary provider in 4 weeks or sooner if needed. - SUMAtriptan (IMITREX) 25 MG tablet; Take 25 mg (1 tablet total) by mouth at the start of the headache. May repeat in 2 hours x 1 if headache persists. Max of 2 tablets/24 hours.  Dispense: 30 tablet; Refill: 2  3. Arthritis of knee, left 4. Arthritis of knee, right - Referral to Orthopedic Surgery for evaluation/management. - Ambulatory referral to Orthopedic Surgery   Patient was given the opportunity to ask questions.  Patient verbalized understanding of the plan and was able to repeat key elements of the plan. Patient was given clear instructions to go to Emergency Department or return to medical center if symptoms don't improve, worsen, or new problems develop.The patient verbalized understanding.   Orders Placed This Encounter  Procedures   Ambulatory referral to Orthopedic Surgery     Requested Prescriptions   Signed Prescriptions Disp Refills   amLODipine  (NORVASC ) 10 MG tablet 90 tablet 0    Sig: Take 1 tablet (10 mg total) by mouth daily.   SUMAtriptan (IMITREX) 25 MG tablet 30 tablet 2    Sig: Take 25 mg (1 tablet total) by mouth at the start of the headache.  May repeat in 2 hours x 1 if headache persists. Max of 2 tablets/24 hours.    Return in about 4 weeks (around 01/31/2024) for Follow-Up or next available chronic conditions.  Senaida Dama, NP

## 2024-02-05 ENCOUNTER — Encounter: Admitting: Family

## 2024-02-05 NOTE — Progress Notes (Signed)
 Erroneous encounter-disregard

## 2024-04-24 ENCOUNTER — Encounter (HOSPITAL_COMMUNITY): Payer: Self-pay

## 2024-04-24 ENCOUNTER — Emergency Department (HOSPITAL_COMMUNITY)
Admission: EM | Admit: 2024-04-24 | Discharge: 2024-04-24 | Discharge status: Against Medical Advice | Attending: Emergency Medicine | Admitting: Emergency Medicine

## 2024-04-24 ENCOUNTER — Other Ambulatory Visit: Payer: Self-pay

## 2024-04-24 ENCOUNTER — Emergency Department (HOSPITAL_COMMUNITY)

## 2024-04-24 DIAGNOSIS — Z79899 Other long term (current) drug therapy: Secondary | ICD-10-CM | POA: Insufficient documentation

## 2024-04-24 DIAGNOSIS — Z720 Tobacco use: Secondary | ICD-10-CM | POA: Diagnosis not present

## 2024-04-24 DIAGNOSIS — J45909 Unspecified asthma, uncomplicated: Secondary | ICD-10-CM | POA: Insufficient documentation

## 2024-04-24 DIAGNOSIS — M25561 Pain in right knee: Secondary | ICD-10-CM | POA: Insufficient documentation

## 2024-04-24 DIAGNOSIS — M25461 Effusion, right knee: Secondary | ICD-10-CM | POA: Insufficient documentation

## 2024-04-24 DIAGNOSIS — I10 Essential (primary) hypertension: Secondary | ICD-10-CM | POA: Insufficient documentation

## 2024-04-24 DIAGNOSIS — Z5329 Procedure and treatment not carried out because of patient's decision for other reasons: Secondary | ICD-10-CM | POA: Diagnosis not present

## 2024-04-24 MED ORDER — OXYCODONE HCL 5 MG PO TABS
5.0000 mg | ORAL_TABLET | Freq: Once | ORAL | Status: AC
  Administered 2024-04-24: 5 mg via ORAL
  Filled 2024-04-24: qty 1

## 2024-04-24 NOTE — ED Provider Notes (Signed)
 [place] EMERGENCY DEPARTMENT AT [facility] Provider Note   CSN: [id number] Arrival date & time: 04/24/24  1346     Patient presents with: Knee Pain   Taylor Sherman is a 38 y.o. female who presents the emergency department with a chief complaint of right knee pain.  Patient denies trauma or injury and states that the right knee pain just popped up overnight as well as the swelling. She states that this happened 1 time previously approximately 2 years ago to her left knee, and that at that time the fluid was drained off in the emergency department and sent off for analysis.  Patient is unsure what the analysis showed.  She states that she does have an appointment coming up with orthopedics regarding her knee next month, but has not been seen by them yet.  Denies fever, chills.  Patient states that she is ambulatory with significant discomfort.  Patient states that when this happened previously the pain just went away on its own after her knee was drained.  Denies open wounds to the area.  Past medical history significant for hypertension, asthma, PTSD, anxiety, marijuana use, tobacco use, cocaine use, etc.    Knee Pain      Prior to Admission medications   Medication Sig Start Date End Date Taking? Authorizing Provider  amLODipine  (NORVASC ) 10 MG tablet Take 1 tablet (10 mg total) by mouth daily. 01/03/24   Lorren Greig PARAS, NP  benzonatate  (TESSALON ) 100 MG capsule Take 1 capsule (100 mg total) by mouth every 8 (eight) hours. Patient not taking: Reported on 10/05/2023 10/21/22   Nivia Colon, PA-C  Blood Pressure Monitoring (BLOOD PRESSURE KIT) DEVI 1 kit by Does not apply route once a week. Patient not taking: Reported on 09/04/2023 05/19/20   Darryle Montie HERO, MD  metroNIDAZOLE  (FLAGYL ) 500 MG tablet Take 1 tablet (500 mg total) by mouth 2 (two) times daily. Patient not taking: Reported on 10/05/2023 02/18/23   Ruthell Lonni FALCON, PA-C  NIFEdipine  (ADALAT  CC) 60 MG 24 hr  tablet Take 1 tablet (60 mg total) by mouth daily. Patient not taking: Reported on 10/05/2023 11/21/20   Fredirick Glenys RAMAN, MD  ondansetron  (ZOFRAN -ODT) 4 MG disintegrating tablet Take 1 tablet (4 mg total) by mouth every 8 (eight) hours as needed. Patient not taking: Reported on 01/03/2024 10/05/23   Lorren Greig PARAS, NP  oseltamivir  (TAMIFLU ) 75 MG capsule Take 1 capsule (75 mg total) by mouth every 12 (twelve) hours. Patient not taking: Reported on 10/05/2023 10/21/22   Nivia Colon, PA-C  Prenatal Vit-Fe Phos-FA-Omega (VITAFOL  GUMMIES) 3.33-0.333-34.8 MG CHEW Chew 3 tablets by mouth daily. Patient not taking: Reported on 09/04/2023 05/19/20   Darryle Montie HERO, MD  SUMAtriptan  (IMITREX ) 25 MG tablet Take 25 mg (1 tablet total) by mouth at the start of the headache. May repeat in 2 hours x 1 if headache persists. Max of 2 tablets/24 hours. 01/03/24   Lorren Greig PARAS, NP    Allergies: Patient has no known allergies.    Review of Systems  Musculoskeletal:  Positive for arthralgias (R knee pain).    Updated Vital Signs BP (!) 150/101 (BP Location: Right Arm)   Pulse 92   Temp 98.6 F (37 C)   Resp 20   Ht 5' 3 (1.6 m)   Wt 61.2 kg   LMP 04/08/2024 (Approximate)   SpO2 99%   Breastfeeding Unknown   BMI 23.91 kg/m   Physical Exam Vitals and nursing note reviewed.  Constitutional:      General: She is awake. She is not in acute distress.    Appearance: Normal appearance. She is not ill-appearing, toxic-appearing or diaphoretic.  Eyes:     General: No scleral icterus. Pulmonary:     Effort: Pulmonary effort is normal. No respiratory distress.  Musculoskeletal:     Right lower leg: No edema.     Left lower leg: No edema.     Comments: Right knee generalized tenderness to palpation, patient has significant discomfort with flexion extension of right knee but is able to do this actively on her own without assistance  Mild joint effusion appreciated with some warmth when compared to left  knee, no appreciated redness  Right lower extremity neurovascularly intact  Skin:    General: Skin is warm.     Capillary Refill: Capillary refill takes less than 2 seconds.     Comments: No appreciated redness, no appreciated open wounds or rashes  Neurological:     General: No focal deficit present.     Mental Status: She is alert and oriented to person, place, and time.  Psychiatric:        Mood and Affect: Mood normal.        Behavior: Behavior normal. Behavior is cooperative.    (all labs ordered are listed, but only abnormal results are displayed) Labs Reviewed - No data to display  EKG: None  Radiology: DG Knee Complete 4 Views Right Result Date: 04/24/2024 CLINICAL DATA:  Acute right knee pain without known injury. EXAM: RIGHT KNEE - COMPLETE 4+ VIEW COMPARISON:  None Available. FINDINGS: No evidence of fracture, dislocation, or joint effusion. No evidence of arthropathy or other focal Gallogly abnormality. Soft tissues are unremarkable. IMPRESSION: Negative. Electronically Signed   By: Lynwood Landy Raddle M.D.   On: 04/24/2024 14:58     Procedures   Medications Ordered in the ED  oxyCODONE  (Oxy IR/ROXICODONE ) immediate release tablet 5 mg (5 mg Oral Given 04/24/24 1840)                                    Medical Decision Making Amount and/or Complexity of Data Reviewed Labs: ordered.  Risk Prescription drug management.   Patient presents to the ED for concern of right knee pain, this involves an extensive number of treatment options, and is a complaint that carries with it a high risk of complications and morbidity.  The differential diagnosis includes septic arthritis, fracture, dislocation, inflammatory arthritis, gout, pseudogout, ligament/tendon injury, etc.   Co morbidities that complicate the patient evaluation  Hypertension, asthma, PTSD, anxiety   Additional history obtained:  Reviewed previous emergency department note from 2023 where patient was seen  for similar complaint on left knee, at this time patient knee was drained and fluid was sent off for analysis, based off results that I can see findings were most consistent with inflammatory arthritis based off of white blood cell count and presentation, no crystals were appreciated   Lab Tests:  I Ordered, and personally interpreted labs.  The pertinent results include:  Patient left AMA prior to labs being received   Imaging Studies ordered:  I ordered imaging studies including xray of R knee complete I independently visualized and interpreted imaging which showed no evidence of acute fracture or dislocation of the right knee, no obvious abnormality I agree with the radiologist interpretation   Medicines ordered and prescription drug management:  I ordered medication including Percocet for pain Reevaluation of the patient after these medicines showed that the patient improved I have reviewed the patients home medicines and have made adjustments as needed   Test Considered:  Considered joint aspiration however patient left AMA prior to this or any other lab work-up being performed.   Critical Interventions:  none   Problem List / ED Course:  38 year old female, vital signs stable, patient afebrile in the emergency department, here for right knee pain that is atraumatic, history of same to left knee approximately 2 years ago, fluid was drained off and diagnosed with inflammatory arthritis On physical exam patient is able to actively flex and extend the right knee however significant discomfort, right knee warm to touch when compared to left, no obvious erythema appreciated, mild joint effusion Low clinical suspicion for septic arthritis as patient is able to walk with discomfort and flex and extend right knee however will obtain lab work and plan to drain knee for joint fluid analysis Percocet given for pain Patient left AMA prior to lab work-up and joint aspiration, unknown  diagnosis at this time, suspicious for inflammatory arthritis like what occurred previously however impossible to know with limited work-up I was unable to give any return precautions or further instructions   Social Determinants of Health:  none   Dispostion:  Patient left AMA prior to work-up completion, unknown diagnosis     Final diagnoses:  Acute pain of right knee    ED Discharge Orders     None          Janetta Terrall JULIANNA DEVONNA 04/25/24 0257    Armenta Canning, MD 04/30/24 1816

## 2024-04-24 NOTE — ED Triage Notes (Addendum)
 C/O right knee pain. Pt states she woke up and having severe pain. No falls/trauma. Minimal swelling noted to knee. No deformities.

## 2024-04-24 NOTE — ED Notes (Signed)
 Patient c/o right arm pain that started when she was sitting in the waiting room.

## 2024-05-24 ENCOUNTER — Emergency Department (HOSPITAL_COMMUNITY)

## 2024-05-24 ENCOUNTER — Encounter (HOSPITAL_COMMUNITY): Payer: Self-pay

## 2024-05-24 ENCOUNTER — Other Ambulatory Visit: Payer: Self-pay

## 2024-05-24 ENCOUNTER — Emergency Department (HOSPITAL_COMMUNITY): Admission: EM | Admit: 2024-05-24 | Discharge: 2024-05-24 | Disposition: A | Discharge status: Home / Self Care

## 2024-05-24 DIAGNOSIS — R1013 Epigastric pain: Secondary | ICD-10-CM | POA: Insufficient documentation

## 2024-05-24 DIAGNOSIS — M549 Dorsalgia, unspecified: Secondary | ICD-10-CM | POA: Diagnosis not present

## 2024-05-24 LAB — I-STAT CHEM 8, ED
BUN: 12 mg/dL (ref 6–20)
BUN: 9 mg/dL (ref 6–20)
Calcium, Ion: 1.03 mmol/L — ABNORMAL LOW (ref 1.15–1.40)
Calcium, Ion: 1.13 mmol/L — ABNORMAL LOW (ref 1.15–1.40)
Chloride: 108 mmol/L (ref 98–111)
Chloride: 104 mmol/L (ref 98–111)
Creatinine, Ser: 0.9 mg/dL (ref 0.44–1.00)
Creatinine, Ser: 1 mg/dL (ref 0.44–1.00)
Glucose, Bld: 82 mg/dL (ref 70–99)
Glucose, Bld: 88 mg/dL (ref 70–99)
HCT: 45 % (ref 36.0–46.0)
HCT: 42 % (ref 36.0–46.0)
Hemoglobin: 15.3 g/dL — ABNORMAL HIGH (ref 12.0–15.0)
Hemoglobin: 14.3 g/dL (ref 12.0–15.0)
Potassium: 3.8 mmol/L (ref 3.5–5.1)
Potassium: 3.9 mmol/L (ref 3.5–5.1)
Sodium: 137 mmol/L (ref 135–145)
Sodium: 137 mmol/L (ref 135–145)
TCO2: 18 mmol/L — ABNORMAL LOW (ref 22–32)
TCO2: 19 mmol/L — ABNORMAL LOW (ref 22–32)

## 2024-05-24 LAB — COMPREHENSIVE METABOLIC PANEL WITH GFR
ALT: 14 U/L (ref 0–44)
AST: 33 U/L (ref 15–41)
Albumin: 3.5 g/dL (ref 3.5–5.0)
Alkaline Phosphatase: 34 U/L — ABNORMAL LOW (ref 38–126)
Anion gap: 14 (ref 5–15)
BUN: 9 mg/dL (ref 6–20)
CO2: 14 mmol/L — ABNORMAL LOW (ref 22–32)
Calcium: 8.5 mg/dL — ABNORMAL LOW (ref 8.9–10.3)
Chloride: 107 mmol/L (ref 98–111)
Creatinine, Ser: 0.98 mg/dL (ref 0.44–1.00)
GFR, Estimated: >60 mL/min (ref >60)
Glucose, Bld: 82 mg/dL (ref 70–99)
Potassium: 4.9 mmol/L (ref 3.5–5.1)
Sodium: 135 mmol/L (ref 135–145)
Total Bilirubin: 1.5 mg/dL — ABNORMAL HIGH (ref 0.0–1.2)
Total Protein: 5.8 g/dL — ABNORMAL LOW (ref 6.5–8.1)

## 2024-05-24 LAB — BASIC METABOLIC PANEL WITH GFR
Anion gap: 9 (ref 5–15)
BUN: 12 mg/dL (ref 6–20)
CO2: 20 mmol/L — ABNORMAL LOW (ref 22–32)
Calcium: 8.5 mg/dL — ABNORMAL LOW (ref 8.9–10.3)
Chloride: 105 mmol/L (ref 98–111)
Creatinine, Ser: 0.94 mg/dL (ref 0.44–1.00)
GFR, Estimated: >60 mL/min (ref >60)
Glucose, Bld: 87 mg/dL (ref 70–99)
Potassium: 3.9 mmol/L (ref 3.5–5.1)
Sodium: 134 mmol/L — ABNORMAL LOW (ref 135–145)

## 2024-05-24 LAB — CBC
HCT: 53.6 % — ABNORMAL HIGH (ref 36.0–46.0)
Hemoglobin: 17.8 g/dL — ABNORMAL HIGH (ref 12.0–15.0)
MCH: 28.8 pg (ref 26.0–34.0)
MCHC: 33.2 g/dL (ref 30.0–36.0)
MCV: 86.7 fL (ref 80.0–100.0)
Platelets: 339 K/uL (ref 150–400)
RBC: 6.18 MIL/uL — ABNORMAL HIGH (ref 3.87–5.11)
RDW: 13.2 % (ref 11.5–15.5)
WBC: 8 K/uL (ref 4.0–10.5)
nRBC: 0 % (ref 0.0–0.2)

## 2024-05-24 LAB — TROPONIN I (HIGH SENSITIVITY)
Troponin I (High Sensitivity): 3 ng/L (ref <18)
Troponin I (High Sensitivity): 5 ng/L (ref <18)

## 2024-05-24 LAB — HCG, SERUM, QUALITATIVE: Preg, Serum: NEGATIVE

## 2024-05-24 LAB — LIPASE, BLOOD: Lipase: 27 U/L (ref 11–51)

## 2024-05-24 MED ORDER — DICYCLOMINE HCL 20 MG PO TABS: 20.0000 mg | ORAL_TABLET | Freq: Two times a day (BID) | ORAL | 0 refills | Status: AC

## 2024-05-24 MED ORDER — ONDANSETRON HCL 4 MG/2ML IJ SOLN
4.0000 mg | Freq: Once | INTRAMUSCULAR | Status: AC
  Administered 2024-05-24: 4 mg via INTRAVENOUS
  Filled 2024-05-24: qty 2

## 2024-05-24 MED ORDER — MORPHINE SULFATE (PF) 2 MG/ML IV SOLN
2.0000 mg | Freq: Once | INTRAVENOUS | Status: AC
  Administered 2024-05-24: 2 mg via INTRAVENOUS
  Filled 2024-05-24: qty 1

## 2024-05-24 MED ORDER — KETOROLAC TROMETHAMINE 15 MG/ML IJ SOLN
15.0000 mg | Freq: Once | INTRAMUSCULAR | Status: AC
  Administered 2024-05-24: 15 mg via INTRAVENOUS
  Filled 2024-05-24: qty 1

## 2024-05-24 MED ORDER — IOHEXOL 350 MG/ML SOLN
75.0000 mL | Freq: Once | INTRAVENOUS | Status: AC | PRN
  Administered 2024-05-24: 75 mL via INTRAVENOUS

## 2024-05-24 MED ORDER — HALOPERIDOL LACTATE 5 MG/ML IJ SOLN
2.0000 mg | Freq: Once | INTRAMUSCULAR | Status: AC
  Administered 2024-05-24: 2 mg via INTRAVENOUS
  Filled 2024-05-24: qty 1

## 2024-05-24 MED ORDER — SODIUM CHLORIDE 0.9 % IV BOLUS
1000.0000 mL | Freq: Once | INTRAVENOUS | Status: AC
  Administered 2024-05-24: 1000 mL via INTRAVENOUS

## 2024-05-24 MED ORDER — LACTATED RINGERS IV BOLUS
1000.0000 mL | Freq: Once | INTRAVENOUS | Status: AC
  Administered 2024-05-24: 1000 mL via INTRAVENOUS

## 2024-05-24 MED ORDER — FAMOTIDINE IN NACL 20-0.9 MG/50ML-% IV SOLN
20.0000 mg | Freq: Once | INTRAVENOUS | Status: AC
  Administered 2024-05-24: 20 mg via INTRAVENOUS
  Filled 2024-05-24: qty 50

## 2024-05-24 NOTE — ED Triage Notes (Signed)
 Patient presents to ed c/o chest pain and sob onset 30 mins ago, hyperventilating  attempted to calm patient down and was able to do so.

## 2024-05-24 NOTE — ED Provider Notes (Signed)
  [place] EMERGENCY DEPARTMENT AT [facility] Provider Assume Care Note I assumed care of Taylor Sherman on 05/24/2024 at 3:30 PM from Dr. Simon.   Briefly, Taylor Sherman is a 38 y.o. female who: PMHx: None pertinent P/w chest pain radiating to back, shortness of breath Recitation concerning for possible dissection, thus getting CTA chest abdomen pelvis, needs troponins X2 for risk stratification  Plan at the time of handoff: Follow-up CTA CAP and troponins   Please refer to the original provider's note for additional information regarding the care of Taylor Sherman.  Reassessment: I personally reassessed the patient: Patient complained of persistent severe pain.   Vital Signs:  ED Triage Vitals  Encounter Vitals Group     BP 05/24/24 1343 (!) 157/109     Girls Systolic BP Percentile --      Girls Diastolic BP Percentile --      Boys Systolic BP Percentile --      Boys Diastolic BP Percentile --      Pulse Rate 05/24/24 1343 (!) 111     Resp 05/24/24 1343 (!) 22     Temp 05/24/24 1343 98.4 F (36.9 C)     Temp Source 05/24/24 1408 Oral     SpO2 05/24/24 1343 100 %     Weight 05/24/24 1354 135 lb (61.2 kg)     Height 05/24/24 1354 5' 2 (1.575 m)     Head Circumference --      Peak Flow --      Pain Score 05/24/24 1415 10     Pain Loc --      Pain Education --      Exclude from Growth Chart --      Hemodynamics:  The patient is hemodynamically stable. Mental Status:  The patient is alert  Additional MDM: Labs and imaging obtained.  Initial troponin 3, delta 5, not consistent with cardiac etiology of symptoms.  Additionally CTA does not demonstrate evidence of acute aortic syndrome.  On reevaluation after morphine , patient reports that pain is unchanged by morphine .  On my evaluation, patient reports pain primarily as an epigastric pain rather than a chest pain that radiates to the back.  Lipase WNL, therefore doubt pancreatitis, additionally no evidence of  pancreatitis on imaging.  Given localization of pain to the abdomen, patient administered Haldol  and Toradol  as well as additional LR bolus for possible intestinal smooth muscle mediated pain.  On reevaluation after these medications, patient reports resolution of pain.  Patient did initially have significant low bicarb, after fluid repletion, this was rechecked and improved to 20 from 14.  Overall given reassuring imaging, resolution of pain after Haldol /Toradol , feel that patient is stable for discharge with prescription for Bentyl .  Patient is amenable to this plan.  Discussed need for follow-up with PCP for repeat bicarb level, and encouraged hydration at home.  Patient expressed understanding, comfortable with this plan.  Disposition: DISCHARGE: I believe that the patient is safe for discharge home with outpatient follow-up. Patient was informed of all pertinent physical exam, laboratory, and imaging findings. Patient's suspected etiology of their symptom presentation was discussed with the patient and all questions were answered. We discussed following up with PCP. I provided thorough ED return precautions. The patient feels safe and comfortable with this plan.   FREDRIK CANDIE Later, MD Emergency Medicine    Later Jerilynn RAMAN, MD 05/27/24 [phone]

## 2024-05-24 NOTE — ED Notes (Signed)
 Pt states she is having chest pain/back pain spasms that comes and goes. Denies any recent injuries.

## 2024-05-24 NOTE — ED Provider Notes (Signed)
 [place] EMERGENCY DEPARTMENT AT [facility] Provider Note   CSN: [id number] Arrival date & time: 05/24/24  1339     Patient presents with: Chest Pain and Shortness of Breath   Taylor Sherman is a 38 y.o. female.    Chest Pain Associated symptoms: shortness of breath   Shortness of Breath Associated symptoms: chest pain    Patient presents with chest pain as well as shortness of breath.  Patient states that she was washing dishes about an hour ago and started with epigastric abdominal pain that radiated to her back.  She is never had anything like this happen in the past.  Patient states is still having ongoing pain.  Patient states that hurts worse when she takes in p.o. intake.  No obvious bladder chest pain or hemoptysis.  Denies any history of DVT or PE.  Patient states it feels like her go straight through to her back.  No numbness or tingling anywhere.  Episodes of nausea but no episodes of vomiting.  Bowel movements have been leading up to this.  Otherwise, denies all complaints.  Previous medical history reviewed : Patient last seen in the ED on April 24, 2024 because of knee pain.  Atraumatic.  Patient left AMA prior to lab work and joint aspiration.     Prior to Admission medications   Medication Sig Start Date End Date Taking? Authorizing Provider  amLODipine  (NORVASC ) 10 MG tablet Take 1 tablet (10 mg total) by mouth daily. 01/03/24   Lorren Greig PARAS, NP  benzonatate  (TESSALON ) 100 MG capsule Take 1 capsule (100 mg total) by mouth every 8 (eight) hours. Patient not taking: Reported on 10/05/2023 10/21/22   Nivia Colon, PA-C  Blood Pressure Monitoring (BLOOD PRESSURE KIT) DEVI 1 kit by Does not apply route once a week. Patient not taking: Reported on 09/04/2023 05/19/20   Darryle Montie HERO, MD  metroNIDAZOLE  (FLAGYL ) 500 MG tablet Take 1 tablet (500 mg total) by mouth 2 (two) times daily. Patient not taking: Reported on 10/05/2023 02/18/23   Ruthell Lonni FALCON,  PA-C  NIFEdipine  (ADALAT  CC) 60 MG 24 hr tablet Take 1 tablet (60 mg total) by mouth daily. Patient not taking: Reported on 10/05/2023 11/21/20   Fredirick Glenys RAMAN, MD  ondansetron  (ZOFRAN -ODT) 4 MG disintegrating tablet Take 1 tablet (4 mg total) by mouth every 8 (eight) hours as needed. Patient not taking: Reported on 01/03/2024 10/05/23   Lorren Greig PARAS, NP  oseltamivir  (TAMIFLU ) 75 MG capsule Take 1 capsule (75 mg total) by mouth every 12 (twelve) hours. Patient not taking: Reported on 10/05/2023 10/21/22   Nivia Colon, PA-C  Prenatal Vit-Fe Phos-FA-Omega (VITAFOL  GUMMIES) 3.33-0.333-34.8 MG CHEW Chew 3 tablets by mouth daily. Patient not taking: Reported on 09/04/2023 05/19/20   Darryle Montie HERO, MD  SUMAtriptan  (IMITREX ) 25 MG tablet Take 25 mg (1 tablet total) by mouth at the start of the headache. May repeat in 2 hours x 1 if headache persists. Max of 2 tablets/24 hours. 01/03/24   Lorren Greig PARAS, NP    Allergies: Patient has no known allergies.    Review of Systems  Respiratory:  Positive for shortness of breath.   Cardiovascular:  Positive for chest pain.    Updated Vital Signs BP (!) 150/93   Pulse 74   Temp 97.7 F (36.5 C) (Oral)   Resp (!) 23   Ht 5' 2 (1.575 m)   Wt 61.2 kg   LMP 04/08/2024 (Approximate)   SpO2 100%  BMI 24.69 kg/m   Physical Exam Vitals and nursing note reviewed.  Constitutional:      General: She is not in acute distress.    Appearance: She is well-developed.  HENT:     Head: Normocephalic and atraumatic.  Eyes:     Conjunctiva/sclera: Conjunctivae normal.  Cardiovascular:     Rate and Rhythm: Normal rate and regular rhythm.     Heart sounds: No murmur heard. Pulmonary:     Effort: Pulmonary effort is normal. No respiratory distress.     Breath sounds: Normal breath sounds.  Abdominal:     Palpations: Abdomen is soft.     Tenderness: There is no abdominal tenderness.   Musculoskeletal:        General: No swelling.       Arms:      Cervical back: Neck supple.  Skin:    General: Skin is warm and dry.     Capillary Refill: Capillary refill takes less than 2 seconds.  Neurological:     Mental Status: She is alert.  Psychiatric:        Mood and Affect: Mood normal.     (all labs ordered are listed, but only abnormal results are displayed) Labs Reviewed  CBC - Abnormal; Notable for the following components:      Result Value   RBC 6.18 (*)    Hemoglobin 17.8 (*)    HCT 53.6 (*)    All other components within normal limits  HCG, SERUM, QUALITATIVE  COMPREHENSIVE METABOLIC PANEL WITH GFR  LIPASE, BLOOD  TROPONIN I (HIGH SENSITIVITY)    EKG: EKG Interpretation Date/Time:  Friday May 24 2024 14:12:03 EDT Ventricular Rate:  87 PR Interval:  130 QRS Duration:  79 QT Interval:  334 QTC Calculation: 402 R Axis:   77  Text Interpretation: Sinus rhythm Biatrial enlargement RSR' in V1 or V2, probably normal variant Confirmed by Simon Rea [phone]) on 05/24/2024 2:13:56 PM  Radiology: [facility] Chest Port 1 View Result Date: 05/24/2024 CLINICAL DATA:  Chest pain EXAM: PORTABLE CHEST 1 VIEW COMPARISON:  05/23/2022 FINDINGS: Single frontal view of the chest demonstrates an unremarkable cardiac silhouette. No acute airspace disease, effusion, or pneumothorax. No acute bony abnormalities. IMPRESSION: 1. Stable chest, no acute process. Electronically Signed   By: Ozell Daring M.D.   On: 05/24/2024 15:04     Procedures   Medications Ordered in the ED  famotidine  (PEPCID ) IVPB 20 mg premix (has no administration in time range)  morphine  (PF) 2 MG/ML injection 2 mg (2 mg Intravenous Given 05/24/24 1415)  ondansetron  (ZOFRAN ) injection 4 mg (4 mg Intravenous Given 05/24/24 1420)  sodium chloride  0.9 % bolus 1,000 mL (1,000 mLs Intravenous New Bag/Given 05/24/24 1425)                                    Medical Decision Making Amount and/or Complexity of Data Reviewed Labs: ordered. Radiology:  ordered.  Risk Prescription drug management.   Patient presents with chest pain as well as shortness of breath.  Patient states that she was washing dishes about an hour ago and started with epigastric abdominal pain that radiated to her back.  She is never had anything like this happen in the past.  Patient states is still having ongoing pain.  Patient states that hurts worse when she takes in p.o. intake.  No obvious bladder chest pain or hemoptysis.  Denies any  history of DVT or PE.  Patient states it feels like her go straight through to her back.  No numbness or tingling anywhere.  Episodes of nausea but no episodes of vomiting.  Bowel movements have been leading up to this.  Otherwise, denies all complaints.  Previous medical history reviewed : Patient last seen in the ED on April 24, 2024 because of knee pain.  Atraumatic.  Patient left AMA prior to lab work and joint aspiration.  Upon exam, patient slightly tachycardic and tachypneic.  Patient upset and in pain.  Lungs exam seem to be reproducible.  Some pain to palpation epigastric right upper quadrant.  Pain to palpation left lower and right lower back as well.  Given radiation into the back, will obtain CTA imaging to rule out any kind of dissection process.  I think this is very low on the differential but still, differential to the point where need to explore this option.  Will also assess for any kind of biliary pathology given the pain in epigastric area as well.  Patient was started on famotidine  here in the ED.  Was given morphine  as well as Zofran  and a bolus of fluid.   EG obtained.  Sinus rhythm.  Pending troponin workup.  Think unlikely to be ACS but will rule out.   Pending CMP, lipase as well as troponin and CT imaging at time of signout.     Final diagnoses:  Epigastric pain  Back pain, unspecified back location, unspecified back pain laterality, unspecified chronicity    ED Discharge Orders     None           Simon Lavonia SAILOR, MD 05/24/24 [phone]

## 2024-05-24 NOTE — Discharge Instructions (Addendum)
 Taylor Sherman  Thank you for allowing us  to take care of you today.  You came to the Emergency Department today because you had severe abdominal pain radiating to your back.  Here we did labs and a CT which showed no abnormality of your organs.  Initially you did have some low bicarb levels.  Our initial medications did not help you, however after your negative CT we gave you a different medication that works more on muscle pain in the abdomen, and you had significant relief with that.  We are sending you a different medication called Bentyl  that also helps with muscular abdominal pain.  We also recommend ibuprofen  and Tylenol  as needed for pain control.  You did have that low bicarb, it got better after fluids on recheck, however it is still slightly low.  We recommend plenty of hydration over the next several days and getting this lab rechecked with your primary care doctor in approximately 1 week.  To-Do: 1. Please follow-up with your primary doctor within 1 week/ as soon as possible.   Please return to the Emergency Department or call 911 if you experience have worsening of your symptoms, or do not get better, chest pain, shortness of breath, severe or significantly worsening pain, high fever, severe confusion, pass out or have any reason to think that you need emergency medical care.   We hope you feel better soon.   Mitzie Later, MD Department of Emergency Medicine [facility]

## 2024-05-24 NOTE — ED Notes (Signed)
 Patient transported to CT

## 2024-06-07 ENCOUNTER — Telehealth: Payer: Self-pay | Admitting: Family

## 2024-06-07 NOTE — Telephone Encounter (Signed)
 A document form has been faxed: Request for medical supplies from [facility], to be filled out by provider. Send document back via Fax within 7-days. Document is located in providers tray at front office.          Fax number: [phone]

## 2024-06-10 NOTE — Telephone Encounter (Signed)
 Shanda from [facility] called to f/u on the medical supply request form that was faxed on 10/3. Advised it was recvd and will be completed and faxed back within 7 business days

## 2024-06-14 NOTE — Telephone Encounter (Signed)
 Provider out of office until 06/19/2024

## 2024-06-24 ENCOUNTER — Telehealth: Payer: Self-pay | Admitting: Emergency Medicine

## 2024-06-24 NOTE — Telephone Encounter (Signed)
 Copied from CRM #[id number]. Topic: Clinical - Order For Equipment >> Jun 24, 2024  4:24 PM Dedra B wrote: Reason for CRM: Loretto from [facility] called to follow up on order for BP monitor that was faxed on 10/3. If any questions, Loretto can be reached at [phone].  I return called and spoke with Rashad and made him aware that I did not receive a fax and the pcp was out so he is goingto refax it today.

## 2024-06-25 ENCOUNTER — Telehealth: Payer: Self-pay | Admitting: Family

## 2024-06-25 NOTE — Telephone Encounter (Signed)
 A document form has been faxed: Request of medical supplies from [facility]: BP monitor, to be filled out by provider. Send document back via Fax within 7-days to 14 days. Document is located in providers tray at front office.          Fax number: [phone]

## 2024-06-25 NOTE — Telephone Encounter (Signed)
 Faxed back on 06/25/2024

## 2024-06-25 NOTE — Telephone Encounter (Signed)
 I faxed back on 06/25/2024

## 2024-07-05 ENCOUNTER — Encounter (HOSPITAL_COMMUNITY): Payer: Self-pay

## 2024-07-05 ENCOUNTER — Emergency Department (HOSPITAL_COMMUNITY)
Admission: EM | Admit: 2024-07-05 | Discharge: 2024-07-05 | Disposition: A | Discharge status: Home / Self Care | Attending: Emergency Medicine | Admitting: Emergency Medicine

## 2024-07-05 ENCOUNTER — Other Ambulatory Visit: Payer: Self-pay

## 2024-07-05 DIAGNOSIS — E860 Dehydration: Secondary | ICD-10-CM | POA: Diagnosis not present

## 2024-07-05 DIAGNOSIS — R1030 Lower abdominal pain, unspecified: Secondary | ICD-10-CM | POA: Insufficient documentation

## 2024-07-05 DIAGNOSIS — R1010 Upper abdominal pain, unspecified: Secondary | ICD-10-CM | POA: Insufficient documentation

## 2024-07-05 DIAGNOSIS — R112 Nausea with vomiting, unspecified: Secondary | ICD-10-CM | POA: Insufficient documentation

## 2024-07-05 DIAGNOSIS — R197 Diarrhea, unspecified: Secondary | ICD-10-CM | POA: Insufficient documentation

## 2024-07-05 LAB — CBC WITH DIFFERENTIAL/PLATELET
Abs Immature Granulocytes: 0.05 K/uL (ref 0.00–0.07)
Basophils Absolute: 0 K/uL (ref 0.0–0.1)
Basophils Relative: 0 %
Eosinophils Absolute: 0.1 K/uL (ref 0.0–0.5)
Eosinophils Relative: 0 %
HCT: 48.7 % — ABNORMAL HIGH (ref 36.0–46.0)
Hemoglobin: 15.6 g/dL — ABNORMAL HIGH (ref 12.0–15.0)
Immature Granulocytes: 0 %
Lymphocytes Relative: 8 %
Lymphs Abs: 1.1 K/uL (ref 0.7–4.0)
MCH: 28.9 pg (ref 26.0–34.0)
MCHC: 32 g/dL (ref 30.0–36.0)
MCV: 90.4 fL (ref 80.0–100.0)
Monocytes Absolute: 0.6 K/uL (ref 0.1–1.0)
Monocytes Relative: 4 %
Neutro Abs: 11.7 K/uL — ABNORMAL HIGH (ref 1.7–7.7)
Neutrophils Relative %: 88 %
Platelets: 291 K/uL (ref 150–400)
RBC: 5.39 MIL/uL — ABNORMAL HIGH (ref 3.87–5.11)
RDW: 13.4 % (ref 11.5–15.5)
WBC: 13.5 K/uL — ABNORMAL HIGH (ref 4.0–10.5)
nRBC: 0 % (ref 0.0–0.2)

## 2024-07-05 LAB — LIPASE, BLOOD: Lipase: 31 U/L (ref 11–51)

## 2024-07-05 LAB — COMPREHENSIVE METABOLIC PANEL WITH GFR
ALT: 23 U/L (ref 0–44)
AST: 30 U/L (ref 15–41)
Albumin: 4.5 g/dL (ref 3.5–5.0)
Alkaline Phosphatase: 55 U/L (ref 38–126)
Anion gap: 11 (ref 5–15)
BUN: 10 mg/dL (ref 6–20)
CO2: 21 mmol/L — ABNORMAL LOW (ref 22–32)
Calcium: 9.8 mg/dL (ref 8.9–10.3)
Chloride: 108 mmol/L (ref 98–111)
Creatinine, Ser: 0.83 mg/dL (ref 0.44–1.00)
GFR, Estimated: >60 mL/min (ref >60)
Glucose, Bld: 125 mg/dL — ABNORMAL HIGH (ref 70–99)
Potassium: 4.2 mmol/L (ref 3.5–5.1)
Sodium: 140 mmol/L (ref 135–145)
Total Bilirubin: 0.3 mg/dL (ref 0.0–1.2)
Total Protein: 7.2 g/dL (ref 6.5–8.1)

## 2024-07-05 LAB — URINALYSIS, ROUTINE W REFLEX MICROSCOPIC
Bilirubin Urine: NEGATIVE
Glucose, UA: NEGATIVE mg/dL
Hgb urine dipstick: NEGATIVE
Ketones, ur: NEGATIVE mg/dL
Leukocytes,Ua: NEGATIVE
Nitrite: NEGATIVE
Protein, ur: NEGATIVE mg/dL
Specific Gravity, Urine: 1.008 (ref 1.005–1.030)
pH: 5 (ref 5.0–8.0)

## 2024-07-05 LAB — HCG, SERUM, QUALITATIVE: Preg, Serum: NEGATIVE

## 2024-07-05 MED ORDER — ONDANSETRON 8 MG PO TBDP: 8.0000 mg | ORAL_TABLET | Freq: Three times a day (TID) | ORAL | 0 refills | Status: AC | PRN

## 2024-07-05 MED ORDER — LACTATED RINGERS IV BOLUS
2000.0000 mL | Freq: Once | INTRAVENOUS | Status: AC
  Administered 2024-07-05: 2000 mL via INTRAVENOUS

## 2024-07-05 MED ORDER — SODIUM CHLORIDE 0.9% FLUSH
3.0000 mL | Freq: Once | INTRAVENOUS | Status: AC
  Administered 2024-07-05: 3 mL via INTRAVENOUS

## 2024-07-05 MED ORDER — LOPERAMIDE HCL 2 MG PO CAPS
4.0000 mg | ORAL_CAPSULE | Freq: Once | ORAL | Status: AC
Start: 2024-07-05 — End: 2024-07-05
  Administered 2024-07-05: 4 mg via ORAL
  Filled 2024-07-05: qty 2

## 2024-07-05 MED ORDER — ONDANSETRON HCL 4 MG/2ML IJ SOLN
4.0000 mg | Freq: Once | INTRAMUSCULAR | Status: AC
Start: 2024-07-05 — End: 2024-07-05
  Administered 2024-07-05: 4 mg via INTRAVENOUS
  Filled 2024-07-05: qty 2

## 2024-07-05 MED ORDER — MORPHINE SULFATE (PF) 4 MG/ML IV SOLN
6.0000 mg | Freq: Once | INTRAVENOUS | Status: AC
  Administered 2024-07-05: 6 mg via INTRAVENOUS
  Filled 2024-07-05: qty 2

## 2024-07-05 NOTE — ED Provider Notes (Signed)
 Patient was initially seen by Dr. Charlyn.  Please see his note.  Patient presented with symptoms of vomiting and diarrhea.  Patient's ED workup was reassuring.  Plan from Dr. Nonna body was for p.o. challenge.  Patient was able to drink Gatorade.  She reports that she is feeling much better and is ready to go home   Randol Simmonds, MD 07/05/24 [phone]

## 2024-07-05 NOTE — Discharge Instructions (Signed)
 You can take the Zofran  to help with any persistent nausea and vomiting.  Return to the ER for fevers chills pain or other concerning symptoms.

## 2024-07-05 NOTE — ED Triage Notes (Signed)
 Pt arrives via EMS from blood donation site. Pt has complaints of nausea, vomiting, and diarrhea that suddenly began this morning. Pt endorses eating pork chops and greens beans for dinner yesterday.   No meds administered by EMS.

## 2024-07-05 NOTE — ED Provider Notes (Signed)
 [place] [facility] AT [facility] Provider Note   CSN: [id number] Arrival date & time: 07/05/24  1200     Patient presents with: Abdominal Pain, Nausea, and Emesis   Glenora A Miu is a 38 y.o. female.   HPI    (S) Kalliope A Balik is a 38 y.o. female with complaint of gastrointestinal symptoms of watery diarrhea, lower abdominal cramps, upper abdominal cramps, nausea, vomiting for 1 days. No blood in stool.  No fevers. Patient had some pork chops yesterday.  No sick contacts.  No recent antibiotics.  Patient denies any substance use disorder.    Prior to Admission medications   Medication Sig Start Date End Date Taking? Authorizing Provider  ondansetron  (ZOFRAN -ODT) 8 MG disintegrating tablet Take 1 tablet (8 mg total) by mouth every 8 (eight) hours as needed for nausea or vomiting. 07/05/24  Yes Randol Simmonds, MD  amLODipine  (NORVASC ) 10 MG tablet Take 1 tablet (10 mg total) by mouth daily. 01/03/24   Lorren Greig PARAS, NP  dicyclomine  (BENTYL ) 20 MG tablet Take 1 tablet (20 mg total) by mouth 2 (two) times daily. 05/24/24   Rogelia Jerilynn RAMAN, MD  SUMAtriptan  (IMITREX ) 25 MG tablet Take 25 mg (1 tablet total) by mouth at the start of the headache. May repeat in 2 hours x 1 if headache persists. Max of 2 tablets/24 hours. Patient not taking: Reported on 05/24/2024 01/03/24   Lorren Greig PARAS, NP    Allergies: Patient has no known allergies.    Review of Systems  Updated Vital Signs BP (!) 160/101 (BP Location: Right Arm)   Pulse 79   Temp 98.1 F (36.7 C) (Oral)   Resp 17   SpO2 100%   Physical Exam  (all labs ordered are listed, but only abnormal results are displayed) Labs Reviewed  COMPREHENSIVE METABOLIC PANEL WITH GFR - Abnormal; Notable for the following components:      Result Value   CO2 21 (*)    Glucose, Bld 125 (*)    All other components within normal limits  URINALYSIS, ROUTINE W REFLEX MICROSCOPIC - Abnormal; Notable for the following  components:   Color, Urine STRAW (*)    All other components within normal limits  CBC WITH DIFFERENTIAL/PLATELET - Abnormal; Notable for the following components:   WBC 13.5 (*)    RBC 5.39 (*)    Hemoglobin 15.6 (*)    HCT 48.7 (*)    Neutro Abs 11.7 (*)    All other components within normal limits  LIPASE, BLOOD  HCG, SERUM, QUALITATIVE    EKG: None  Radiology: No results found.   Procedures   Medications Ordered in the ED  sodium chloride  flush (NS) 0.9 % injection 3 mL (3 mLs Intravenous Given 07/05/24 1248)  lactated ringers  bolus 2,000 mL (0 mLs Intravenous Stopped 07/05/24 1503)  ondansetron  (ZOFRAN ) injection 4 mg (4 mg Intravenous Given 07/05/24 1349)  loperamide (IMODIUM) capsule 4 mg (4 mg Oral Given 07/05/24 1351)  morphine  (PF) 4 MG/ML injection 6 mg (6 mg Intravenous Given 07/05/24 1352)                                    Medical Decision Making Amount and/or Complexity of Data Reviewed Labs: ordered.  Risk Prescription drug management.   (O) Physical exam reveals the patient appears well. Hydration status: mildly dehydrated. Abdomen: abdomen is soft without significant tenderness, masses, organomegaly  or guarding. no guarding or rigidity.  Differential diagnosis for this patient includes gastroenteritis, colitis, severe dehydration, electrolyte abnormality. Patient has no peritonitis on exam.  IV hydration initiated.  Oral challenge initiated.  Return precautions discussed, with anticipation that patient will likely be discharged after passing oral challenge.  (A) Viral Gastroenteritis  (P) I have recommended small amounts clear fluids frequently, soups, juices, water , 'BRAT' diet, advance diet as tolerated, and Imodium OTC prn diarrhea. Return office visit if symptoms persist or worsen; I have alerted the patient to call if high fever, dehydration, marked weakness, fainting, increased abdominal pain, blood in stool or vomit.   Final diagnoses:   Nausea vomiting and diarrhea    ED Discharge Orders          Ordered    ondansetron  (ZOFRAN -ODT) 8 MG disintegrating tablet  Every 8 hours PRN        07/05/24 1602               Charlyn Sora, MD 07/05/24 1604

## 2024-07-05 NOTE — ED Notes (Signed)
 Gave patient Gatorlyte to drink

## 2024-07-31 ENCOUNTER — Encounter: Payer: Self-pay | Admitting: Family Medicine

## 2024-07-31 ENCOUNTER — Ambulatory Visit (INDEPENDENT_AMBULATORY_CARE_PROVIDER_SITE_OTHER): Admitting: Family Medicine

## 2024-07-31 VITALS — BP 139/82 | HR 55 | Ht 62.0 in | Wt 133.4 lb

## 2024-07-31 DIAGNOSIS — Z13228 Encounter for screening for other metabolic disorders: Secondary | ICD-10-CM

## 2024-07-31 DIAGNOSIS — Z0000 Encounter for general adult medical examination without abnormal findings: Secondary | ICD-10-CM

## 2024-07-31 DIAGNOSIS — Z1329 Encounter for screening for other suspected endocrine disorder: Secondary | ICD-10-CM | POA: Diagnosis not present

## 2024-07-31 DIAGNOSIS — Z136 Encounter for screening for cardiovascular disorders: Secondary | ICD-10-CM

## 2024-07-31 DIAGNOSIS — Z130 Encounter for screening for diseases of the blood and blood-forming organs and certain disorders involving the immune mechanism: Secondary | ICD-10-CM | POA: Diagnosis not present

## 2024-07-31 MED ORDER — AMLODIPINE BESYLATE 10 MG PO TABS: 10.0000 mg | ORAL_TABLET | Freq: Every day | ORAL | 1 refills | Status: AC

## 2024-07-31 NOTE — Progress Notes (Signed)
 Established Patient Office Visit  Subjective    Patient ID: Taylor Sherman, female    DOB: Jun 04, 1986  Age: 38 y.o. MRN: [id number]  CC:  Chief Complaint  Patient presents with   Annual Exam    HPI Taylor Sherman presents for routine annual exam. Patient denies acute complaints or concerns.   Outpatient Encounter Medications as of 07/31/2024  Medication Sig   amLODipine  (NORVASC ) 10 MG tablet Take 1 tablet (10 mg total) by mouth daily.   dicyclomine  (BENTYL ) 20 MG tablet Take 1 tablet (20 mg total) by mouth 2 (two) times daily.   ondansetron  (ZOFRAN -ODT) 8 MG disintegrating tablet Take 1 tablet (8 mg total) by mouth every 8 (eight) hours as needed for nausea or vomiting.   SUMAtriptan  (IMITREX ) 25 MG tablet Take 25 mg (1 tablet total) by mouth at the start of the headache. May repeat in 2 hours x 1 if headache persists. Max of 2 tablets/24 hours. (Patient not taking: Reported on 05/24/2024)   [DISCONTINUED] amLODipine  (NORVASC ) 10 MG tablet Take 1 tablet (10 mg total) by mouth daily. (Patient not taking: Reported on 07/31/2024)   No facility-administered encounter medications on file as of 07/31/2024.    Past Medical History:  Diagnosis Date   Anxiety    Asthma    Hypertension    PTSD (post-traumatic stress disorder)     Past Surgical History:  Procedure Laterality Date   LEEP     TUBAL LIGATION N/A 11/19/2020   Procedure: POST PARTUM TUBAL LIGATION;  Surgeon: Izell Harari, MD;  Location: [facility] LD ORS;  Service: Gynecology;  Laterality: N/A;    Family History  Problem Relation Age of Onset   Hypertension Mother    Diabetes Mother    Hypertension Maternal Grandmother    Heart disease Maternal Grandmother     Social History   Socioeconomic History   Marital status: Single    Spouse name: Not on file   Number of children: Not on file   Years of education: Not on file   Highest education level: Not on file  Occupational History   Not on file  Tobacco Use    Smoking status: Former    Types: Cigarettes   Smokeless tobacco: Never   Tobacco comments:    about 2 cigs a day  Vaping Use   Vaping status: Every Day  Substance and Sexual Activity   Alcohol use: Not Currently    Comment: Last drink was a month ago   Drug use: No   Sexual activity: Yes    Partners: Male    Birth control/protection: None  Other Topics Concern   Not on file  Social History Narrative   Not on file   Social Drivers of Health   Financial Resource Strain: Low Risk  (10/05/2023)   Overall Financial Resource Strain (CARDIA)    Difficulty of Paying Living Expenses: Not hard at all  Food Insecurity: No Food Insecurity (10/05/2023)   Hunger Vital Sign    Worried About Running Out of Food in the Last Year: Never true    Ran Out of Food in the Last Year: Never true  Transportation Needs: No Transportation Needs (10/05/2023)   PRAPARE - Administrator, Civil Service (Medical): No    Lack of Transportation (Non-Medical): No  Physical Activity: Sufficiently Active (01/03/2024)   Exercise Vital Sign    Days of Exercise per Week: 7 days    Minutes of Exercise per Session: 30 min  Stress: No Stress Concern Present (10/05/2023)   [employer] of Occupational Health - Occupational Stress Questionnaire    Feeling of Stress : Only a little  Social Connections: Moderately Isolated (01/03/2024)   Social Connection and Isolation Panel    Frequency of Communication with Friends and Family: More than three times a week    Frequency of Social Gatherings with Friends and Family: More than three times a week    Attends Religious Services: 1 to 4 times per year    Active Member of [employer] or Organizations: No    Attends Banker Meetings: Never    Marital Status: Never married  Intimate Partner Violence: Not At Risk (10/05/2023)   Humiliation, Afraid, Rape, and Kick questionnaire    Fear of Current or Ex-Partner: No    Emotionally Abused: No    Physically  Abused: No    Sexually Abused: No    Review of Systems  All other systems reviewed and are negative.       Objective    BP 139/82   Pulse (!) 55   Ht 5' 2 (1.575 m)   Wt 133 lb 6.4 oz (60.5 kg)   LMP 07/24/2024 (Approximate)   SpO2 98%   BMI 24.40 kg/m   Physical Exam Vitals and nursing note reviewed.  Constitutional:      General: She is not in acute distress. HENT:     Head: Normocephalic and atraumatic.     Right Ear: Tympanic membrane, ear canal and external ear normal.     Left Ear: Tympanic membrane, ear canal and external ear normal.     Nose: Nose normal.     Mouth/Throat:     Mouth: Mucous membranes are moist.     Pharynx: Oropharynx is clear.  Eyes:     Conjunctiva/sclera: Conjunctivae normal.     Pupils: Pupils are equal, round, and reactive to light.  Neck:     Thyroid: No thyromegaly.  Cardiovascular:     Rate and Rhythm: Normal rate and regular rhythm.     Heart sounds: Normal heart sounds. No murmur heard. Pulmonary:     Effort: Pulmonary effort is normal. No respiratory distress.     Breath sounds: Normal breath sounds.  Abdominal:     General: There is no distension.     Palpations: Abdomen is soft. There is no mass.     Tenderness: There is no abdominal tenderness.  Musculoskeletal:        General: Normal range of motion.     Cervical back: Normal range of motion and neck supple.  Skin:    General: Skin is warm and dry.  Neurological:     General: No focal deficit present.     Mental Status: She is alert and oriented to person, place, and time.  Psychiatric:        Mood and Affect: Mood normal.        Behavior: Behavior normal.         Assessment & Plan:   Annual physical exam -     Comprehensive metabolic panel with GFR  Screening for deficiency anemia -     CBC with Differential/Platelet  Encounter for screening for cardiovascular disorders -     Lipid panel  Screening for endocrine/metabolic/immunity disorders -      Hemoglobin A1c  Other orders -     amLODIPine  Besylate; Take 1 tablet (10 mg total) by mouth daily.  Dispense: 90 tablet; Refill: 1  Return in about 6 months (around 01/28/2025) for follow up, chronic med issues.   Tanda Raguel SQUIBB, MD

## 2024-08-01 LAB — LIPID PANEL
Chol/HDL Ratio: 3 ratio (ref 0.0–4.4)
Cholesterol, Total: 186 mg/dL (ref 100–199)
HDL: 61 mg/dL (ref >39)
LDL Chol Calc (NIH): 110 mg/dL — ABNORMAL HIGH (ref 0–99)
Triglycerides: 80 mg/dL (ref 0–149)
VLDL Cholesterol Cal: 15 mg/dL (ref 5–40)

## 2024-08-01 LAB — CBC WITH DIFFERENTIAL/PLATELET
Basophils Absolute: 0 x10E3/uL (ref 0.0–0.2)
Basos: 1 %
EOS (ABSOLUTE): 0.2 x10E3/uL (ref 0.0–0.4)
Eos: 5 %
Hematocrit: 49.2 % — ABNORMAL HIGH (ref 34.0–46.6)
Hemoglobin: 15.5 g/dL (ref 11.1–15.9)
Immature Grans (Abs): 0 x10E3/uL (ref 0.0–0.1)
Immature Granulocytes: 0 %
Lymphocytes Absolute: 1.9 x10E3/uL (ref 0.7–3.1)
Lymphs: 39 %
MCH: 28.7 pg (ref 26.6–33.0)
MCHC: 31.5 g/dL (ref 31.5–35.7)
MCV: 91 fL (ref 79–97)
Monocytes Absolute: 0.5 x10E3/uL (ref 0.1–0.9)
Monocytes: 10 %
Neutrophils Absolute: 2.3 x10E3/uL (ref 1.4–7.0)
Neutrophils: 45 %
Platelets: 305 x10E3/uL (ref 150–450)
RBC: 5.4 x10E6/uL — ABNORMAL HIGH (ref 3.77–5.28)
RDW: 12.8 % (ref 11.7–15.4)
WBC: 5 x10E3/uL (ref 3.4–10.8)

## 2024-08-01 LAB — COMPREHENSIVE METABOLIC PANEL WITH GFR
ALT: 15 IU/L (ref 0–32)
AST: 20 IU/L (ref 0–40)
Albumin: 4.2 g/dL (ref 3.9–4.9)
Alkaline Phosphatase: 63 IU/L (ref 41–116)
BUN/Creatinine Ratio: 20 (ref 9–23)
BUN: 16 mg/dL (ref 6–20)
Bilirubin Total: 0.2 mg/dL (ref 0.0–1.2)
CO2: 19 mmol/L — ABNORMAL LOW (ref 20–29)
Calcium: 10.2 mg/dL (ref 8.7–10.2)
Chloride: 103 mmol/L (ref 96–106)
Creatinine, Ser: 0.79 mg/dL (ref 0.57–1.00)
Globulin, Total: 2.2 g/dL (ref 1.5–4.5)
Glucose: 59 mg/dL — ABNORMAL LOW (ref 70–99)
Potassium: 4.6 mmol/L (ref 3.5–5.2)
Sodium: 138 mmol/L (ref 134–144)
Total Protein: 6.4 g/dL (ref 6.0–8.5)
eGFR: 99 mL/min/1.73 (ref >59)

## 2024-08-01 LAB — HEMOGLOBIN A1C
Est. average glucose Bld gHb Est-mCnc: 103 mg/dL
Hgb A1c MFr Bld: 5.2 % (ref 4.8–5.6)

## 2024-08-13 NOTE — Progress Notes (Signed)
 Pt attended 06/27/2024 screening event with BP of 138/91.   Per initial f/u courtesy in-basket message was sent to pt's PCP.  Per chart review pt does have a PCP (Taylor Sherman; [facility]), insurance, and is not a former smoker. Pt's last appt with PCP was 01/03/2024 and has an upcoming appt on 09/11/2024. Pt does not indicate any SDOH needs at this time.  No additional pt f/u to be scheduled at this time per health equity protocol.

## 2024-08-20 ENCOUNTER — Ambulatory Visit: Payer: Self-pay | Admitting: Family Medicine

## 2024-09-11 ENCOUNTER — Telehealth: Payer: Self-pay | Admitting: Family

## 2024-09-11 ENCOUNTER — Ambulatory Visit: Payer: Self-pay

## 2024-09-11 ENCOUNTER — Encounter: Admitting: Family

## 2024-09-11 NOTE — Telephone Encounter (Signed)
 Called pt to reschedule missed appt; could not reach or leave vm due to full vm box

## 2024-09-11 NOTE — Telephone Encounter (Signed)
 FYI Only or Action Required?: FYI only for provider: UC advised.  Patient was last seen in primary care on 07/31/2024 by Tanda Bleacher, MD.  Called Nurse Triage reporting Headache, Cough, Nasal Congestion, and Abdominal Pain.  Symptoms began yesterday.  Interventions attempted: Rest, hydration, or home remedies.  Symptoms are: unchanged.  Triage Disposition: See HCP Within 4 Hours (Or PCP Triage)  Patient/caregiver understands and will follow disposition?: Yes  Reason for Disposition  [1] MILD-MODERATE pain AND [2] constant AND [3] present > 2 hours  [1] MILD difficulty breathing (e.g., minimal/no SOB at rest, SOB with walking, pulse < 100) AND [2] still present when not coughing  Answer Assessment - Initial Assessment Questions Patient is calling in with cough, congestion, and abdominal pain. She states that the cough and congestion started last night. She reports chills and mild SOB when coughing a lot. Patient also reports 5/10 dull abdominal pain that suddenly came on this morning. It has been constant and worsens with certain movements. She also reports intermittent nausea starting recently as well. No available appts for today, UC advised.   1. LOCATION: Where does it hurt?      Right lower abdomen/flank  2. RADIATION: Does the pain shoot anywhere else? (e.g., chest, back)     No  3. ONSET: When did the pain begin? (e.g., minutes, hours or days ago)      This morning  4. SUDDEN: Gradual or sudden onset?     Sudden  5. PATTERN Does the pain come and go, or is it constant?     Constant  6. SEVERITY: How bad is the pain?  (e.g., Scale 1-10; mild, moderate, or severe)     5/10  7. RECURRENT SYMPTOM: Have you ever had this type of stomach pain before? If Yes, ask: When was the last time? and What happened that time?      No  8. CAUSE: What do you think is causing the stomach pain? (e.g., gallstones, recent abdominal surgery)     Went to ED about a  month ago for irregular bowel movements  9. RELIEVING/AGGRAVATING FACTORS: What makes it better or worse? (e.g., antacids, bending or twisting motion, bowel movement)     Movements like squatting or the way she it sitting  10. OTHER SYMPTOMS: Do you have any other symptoms? (e.g., back pain, diarrhea, fever, urination pain, vomiting)       Intermittent nausea  11. PREGNANCY: Is there any chance you are pregnant? When was your last menstrual period?       No  Answer Assessment - Initial Assessment Questions 1. ONSET: When did the cough begin?      Last night  2. SEVERITY: How bad is the cough today?      Moderate  3. SPUTUM: Describe the color of your sputum (e.g., none, dry cough; clear, white, yellow, green)     Clear  4. HEMOPTYSIS: Are you coughing up any blood? If Yes, ask: How much? (e.g., flecks, streaks, tablespoons, etc.)     No  5. DIFFICULTY BREATHING: Are you having difficulty breathing? If Yes, ask: How bad is it? (e.g., mild, moderate, severe)      Mild sob with a lot of coughing  6. FEVER: Do you have a fever? If Yes, ask: What is your temperature, how was it measured, and when did it start?     No, chills  7. CARDIAC HISTORY: Do you have any history of heart disease? (e.g., heart attack, congestive heart failure)      *  No Answer* 8. LUNG HISTORY: Do you have any history of lung disease?  (e.g., pulmonary embolus, asthma, emphysema)     *No Answer* 9. PE RISK FACTORS: Do you have a history of blood clots? (or: recent major surgery, recent prolonged travel, bedridden)     *No Answer* 10. OTHER SYMPTOMS: Do you have any other symptoms? (e.g., runny nose, wheezing, chest pain)       *No Answer* 11. PREGNANCY: Is there any chance you are pregnant? When was your last menstrual period?       No  12. TRAVEL: Have you traveled out of the country in the last month? (e.g., travel history, exposures)       No  Protocols used:  Abdominal Pain - Female-A-AH, Cough - Acute Productive-A-AH  Copied from CRM #[id number]. Topic: Clinical - Red Word Triage >> Sep 11, 2024 12:14 PM Winona R wrote: Headache, cough, chest and Nasal congestion,  stomach pain on bottom right. Pt calling to resch her pap that she missed yesterday but she is experiencing the new symptoms listed

## 2024-09-11 NOTE — Progress Notes (Signed)
 Erroneous encounter-disregard

## 2024-09-13 NOTE — Telephone Encounter (Signed)
 I called patient with pcp recommendations no one answered so I left a voicemail to return my call.

## 2024-09-13 NOTE — Telephone Encounter (Signed)
 Report to Emergency Department/Urgent Care/call 911 for immediate medical evaluation. Follow-up with Primary Care.

## 2024-09-16 ENCOUNTER — Ambulatory Visit
Admission: EM | Admit: 2024-09-16 | Discharge: 2024-09-16 | Disposition: A | Discharge status: Home / Self Care | Attending: Physician Assistant | Admitting: Physician Assistant

## 2024-09-16 ENCOUNTER — Encounter: Payer: Self-pay | Admitting: Emergency Medicine

## 2024-09-16 DIAGNOSIS — J111 Influenza due to unidentified influenza virus with other respiratory manifestations: Secondary | ICD-10-CM

## 2024-09-16 MED ORDER — IBUPROFEN 800 MG PO TABS
800.0000 mg | ORAL_TABLET | Freq: Once | ORAL | Status: AC
  Administered 2024-09-16: 800 mg via ORAL

## 2024-09-16 NOTE — Discharge Instructions (Addendum)
 Tylenol  or ibuprofen  every 4 hours as needed.

## 2024-09-16 NOTE — ED Triage Notes (Signed)
 Patient has nasal congestion, headache, body aches, and a cough that started Saturday.  Patient has been taken over the counter medication.

## 2024-09-16 NOTE — ED Provider Notes (Signed)
 " [facility]    CSN: [id number] Arrival date & time: 09/16/24  0834      History   Chief Complaint Chief Complaint  Patient presents with   Cough   Generalized Body Aches   Headache   Nasal Congestion    HPI Taylor Sherman is a 39 y.o. female.   Pt complains of a cough and body aches.  Pt thinks she may have the flu.  Pt reports symptoms began 3 days ago.  Pt denies shortness of breath.    The history is provided by the patient. No language interpreter was used.  Cough Cough characteristics:  Non-productive Sputum characteristics:  Nondescript Severity:  Moderate Onset quality:  Gradual Timing:  Constant Progression:  Worsening Chronicity:  New Context: sick contacts   Associated symptoms: headaches   Headache Associated symptoms: cough     Past Medical History:  Diagnosis Date   Anxiety    Asthma    Hypertension    PTSD (post-traumatic stress disorder)     Patient Active Problem List   Diagnosis Date Noted   Encounter for postpartum visit 01/19/2021   Hypertension    Cocaine use complicating pregnancy 11/18/2020   Marijuana use 11/18/2020   Tobacco use in pregnancy 11/18/2020   IUGR (intrauterine growth restriction) affecting care of mother 11/18/2020   Vaginal delivery 11/18/2020   Severe preeclampsia 11/18/2020   Supervision of high risk pregnancy, antepartum 11/17/2020   [redacted] weeks gestation of pregnancy 10/05/2020   Hx LEEP (loop electrosurgical excision procedure), cervix, pregnancy, first trimester 06/03/2020   [redacted] weeks gestation of pregnancy 06/03/2020   Chronic pre-existing hypertension during pregnancy 02/24/2018   Cervical intraepithelial neoplasia grade III with severe dysplasia 02/15/2018    Past Surgical History:  Procedure Laterality Date   LEEP     TUBAL LIGATION N/A 11/19/2020   Procedure: POST PARTUM TUBAL LIGATION;  Surgeon: Izell Harari, MD;  Location: [facility] LD ORS;  Service: Gynecology;  Laterality: N/A;    OB  History     Gravida  6   Para  4   Term  2   Preterm  2   AB  2   Living  4      SAB  2   IAB      Ectopic      Multiple  0   Live Births  4            Home Medications    Prior to Admission medications  Medication Sig Start Date End Date Taking? Authorizing Provider  amLODipine  (NORVASC ) 10 MG tablet Take 1 tablet (10 mg total) by mouth daily. 07/31/24   Tanda Bleacher, MD  dicyclomine  (BENTYL ) 20 MG tablet Take 1 tablet (20 mg total) by mouth 2 (two) times daily. 05/24/24   Rogelia Jerilynn RAMAN, MD  ondansetron  (ZOFRAN -ODT) 8 MG disintegrating tablet Take 1 tablet (8 mg total) by mouth every 8 (eight) hours as needed for nausea or vomiting. 07/05/24   Randol Simmonds, MD  SUMAtriptan  (IMITREX ) 25 MG tablet Take 25 mg (1 tablet total) by mouth at the start of the headache. May repeat in 2 hours x 1 if headache persists. Max of 2 tablets/24 hours. Patient not taking: Reported on 05/24/2024 01/03/24   Jaycee Greig PARAS, NP    Family History Family History  Problem Relation Age of Onset   Hypertension Mother    Diabetes Mother    Hypertension Maternal Grandmother    Heart disease Maternal Grandmother  Social History Social History[1]   Allergies   Patient has no known allergies.   Review of Systems Review of Systems  Respiratory:  Positive for cough.   Neurological:  Positive for headaches.  All other systems reviewed and are negative.    Physical Exam Triage Vital Signs ED Triage Vitals  Encounter Vitals Group     BP 09/16/24 0943 136/89     Girls Systolic BP Percentile --      Girls Diastolic BP Percentile --      Boys Systolic BP Percentile --      Boys Diastolic BP Percentile --      Pulse Rate 09/16/24 0943 65     Resp 09/16/24 0943 18     Temp 09/16/24 0943 98 F (36.7 C)     Temp Source 09/16/24 0943 Oral     SpO2 09/16/24 0943 98 %     Weight --      Height --      Head Circumference --      Peak Flow --      Pain Score 09/16/24 0941 8      Pain Loc --      Pain Education --      Exclude from Growth Chart --    No data found.  Updated Vital Signs BP 136/89 (BP Location: Left Arm)   Pulse 65   Temp 98 F (36.7 C) (Oral)   Resp 18   LMP 09/13/2024 (Exact Date)   SpO2 98%   Breastfeeding No   Visual Acuity Right Eye Distance:   Left Eye Distance:   Bilateral Distance:    Right Eye Near:   Left Eye Near:    Bilateral Near:     Physical Exam Vitals and nursing note reviewed.  Constitutional:      Appearance: She is well-developed.  HENT:     Head: Normocephalic.  Cardiovascular:     Rate and Rhythm: Normal rate.  Pulmonary:     Effort: Pulmonary effort is normal.  Abdominal:     General: There is no distension.  Musculoskeletal:        General: Normal range of motion.     Cervical back: Normal range of motion.  Skin:    General: Skin is warm.  Neurological:     General: No focal deficit present.     Mental Status: She is alert and oriented to person, place, and time.  Psychiatric:        Mood and Affect: Mood normal.      UC Treatments / Results  Labs (all labs ordered are listed, but only abnormal results are displayed) Labs Reviewed - No data to display  EKG   Radiology No results found.  Procedures Procedures (including critical care time)  Medications Ordered in UC Medications  ibuprofen  (ADVIL ) tablet 800 mg (800 mg Oral Given 09/16/24 1103)    Initial Impression / Assessment and Plan / UC Course  I have reviewed the triage vital signs and the nursing notes.  Pertinent labs & imaging results that were available during my care of the patient were reviewed by me and considered in my medical decision making (see chart for details).     Pt has symptoms consistent with influenza.  Pt counseled on symptomatic treatment.  Final Clinical Impressions(s) / UC Diagnoses   Final diagnoses:  Influenza     Discharge Instructions      Tylenol  or ibuprofen  every 4 hours as  needed.  ED Prescriptions   None    PDMP not reviewed this encounter. An After Visit Summary was printed and given to the patient.        [1]  Social History Tobacco Use   Smoking status: Former    Types: Cigarettes   Smokeless tobacco: Never   Tobacco comments:    about 2 cigs a day  Vaping Use   Vaping status: Every Day  Substance Use Topics   Alcohol use: Not Currently    Comment: Last drink was a month ago   Drug use: No     Flint Sonny POUR, [place] 09/16/24 1112  "

## 2025-01-28 ENCOUNTER — Ambulatory Visit: Admitting: Family
